# Patient Record
Sex: Male | Born: 1955 | Race: White | Hispanic: No | Marital: Married | Smoking: Never smoker
Health system: Southern US, Community
[De-identification: ages and names within clinical notes are randomized; demographics above are authoritative.]

## PROBLEM LIST (undated history)

## (undated) DIAGNOSIS — E785 Hyperlipidemia, unspecified: Secondary | ICD-10-CM

## (undated) DIAGNOSIS — C801 Malignant (primary) neoplasm, unspecified: Secondary | ICD-10-CM

## (undated) DIAGNOSIS — E079 Disorder of thyroid, unspecified: Secondary | ICD-10-CM

## (undated) HISTORY — DX: Hyperlipidemia, unspecified: E78.5

## (undated) HISTORY — DX: Malignant (primary) neoplasm, unspecified: C80.1

## (undated) HISTORY — PX: TONSILLECTOMY: SUR1361

---

## 2008-05-08 ENCOUNTER — Ambulatory Visit: Payer: Self-pay | Admitting: Diagnostic Radiology

## 2008-05-08 ENCOUNTER — Ambulatory Visit (HOSPITAL_BASED_OUTPATIENT_CLINIC_OR_DEPARTMENT_OTHER): Admission: RE | Admit: 2008-05-08 | Discharge: 2008-05-08 | Payer: Self-pay | Admitting: Internal Medicine

## 2010-09-24 ENCOUNTER — Ambulatory Visit: Payer: Self-pay | Admitting: Internal Medicine

## 2011-09-24 ENCOUNTER — Telehealth: Payer: Self-pay | Admitting: Internal Medicine

## 2011-09-24 ENCOUNTER — Encounter: Payer: Self-pay | Admitting: Internal Medicine

## 2011-09-24 ENCOUNTER — Ambulatory Visit (INDEPENDENT_AMBULATORY_CARE_PROVIDER_SITE_OTHER): Payer: BC Managed Care – PPO | Admitting: Internal Medicine

## 2011-09-24 VITALS — BP 124/90 | HR 67 | Temp 98.1°F | Resp 16 | Ht 71.0 in | Wt 218.0 lb

## 2011-09-24 DIAGNOSIS — E785 Hyperlipidemia, unspecified: Secondary | ICD-10-CM

## 2011-09-24 DIAGNOSIS — K760 Fatty (change of) liver, not elsewhere classified: Secondary | ICD-10-CM

## 2011-09-24 DIAGNOSIS — K7689 Other specified diseases of liver: Secondary | ICD-10-CM

## 2011-09-24 DIAGNOSIS — E039 Hypothyroidism, unspecified: Secondary | ICD-10-CM

## 2011-09-24 DIAGNOSIS — Z0000 Encounter for general adult medical examination without abnormal findings: Secondary | ICD-10-CM

## 2011-09-24 LAB — LIPID PANEL
Cholesterol: 151 mg/dL (ref 0–200)
LDL Cholesterol: 69 mg/dL (ref 0–99)
Total CHOL/HDL Ratio: 4.9 Ratio
Triglycerides: 256 mg/dL — ABNORMAL HIGH (ref <150)
VLDL: 51 mg/dL — ABNORMAL HIGH (ref 0–40)

## 2011-09-24 LAB — HEPATIC FUNCTION PANEL
ALT: 79 U/L — ABNORMAL HIGH (ref 0–53)
Indirect Bilirubin: 0.6 mg/dL (ref 0.0–0.9)
Total Protein: 7.6 g/dL (ref 6.0–8.3)

## 2011-09-24 LAB — BASIC METABOLIC PANEL
BUN: 18 mg/dL (ref 6–23)
Chloride: 103 mEq/L (ref 96–112)
Glucose, Bld: 90 mg/dL (ref 70–99)
Potassium: 4.4 mEq/L (ref 3.5–5.3)
Sodium: 139 mEq/L (ref 135–145)

## 2011-09-24 LAB — CBC WITH DIFFERENTIAL/PLATELET
Basophils Absolute: 0 10*3/uL (ref 0.0–0.1)
Basophils Relative: 0 % (ref 0–1)
HCT: 46.6 % (ref 39.0–52.0)
Hemoglobin: 15.7 g/dL (ref 13.0–17.0)
Lymphocytes Relative: 44 % (ref 12–46)
Monocytes Absolute: 0.4 10*3/uL (ref 0.1–1.0)
Monocytes Relative: 9 % (ref 3–12)
Neutro Abs: 2.1 10*3/uL (ref 1.7–7.7)
Neutrophils Relative %: 45 % (ref 43–77)
WBC: 4.6 10*3/uL (ref 4.0–10.5)

## 2011-09-24 LAB — TSH: TSH: 5.645 u[IU]/mL — ABNORMAL HIGH (ref 0.350–4.500)

## 2011-09-24 LAB — T4, FREE: Free T4: 1.25 ng/dL (ref 0.80–1.80)

## 2011-09-24 LAB — PSA: PSA: 0.39 ng/mL (ref <=4.00)

## 2011-09-24 MED ORDER — LEVOTHYROXINE SODIUM 50 MCG PO TABS: 50.0000 ug | ORAL_TABLET | Freq: Every day | ORAL | Status: DC

## 2011-09-24 NOTE — Patient Instructions (Signed)
Please schedule labs prior to your next visit (tsh/free t4-hypothyroidism) Also please sign a record release for your last colonoscopy report

## 2011-09-24 NOTE — Telephone Encounter (Signed)
Please schedule labs prior to your next visit (tsh/free t4-hypothyroidism)    Patient has upcoming appointment in march 2014. He will be going to [facility] lab.

## 2011-09-25 LAB — URINALYSIS, ROUTINE W REFLEX MICROSCOPIC
Bilirubin Urine: NEGATIVE
Hgb urine dipstick: NEGATIVE
Ketones, ur: NEGATIVE mg/dL
Nitrite: NEGATIVE
Protein, ur: NEGATIVE mg/dL
Specific Gravity, Urine: 1.017 (ref 1.005–1.030)
Urobilinogen, UA: 0.2 mg/dL (ref 0.0–1.0)

## 2011-09-26 DIAGNOSIS — K760 Fatty (change of) liver, not elsewhere classified: Secondary | ICD-10-CM | POA: Insufficient documentation

## 2011-09-26 DIAGNOSIS — Z0000 Encounter for general adult medical examination without abnormal findings: Secondary | ICD-10-CM | POA: Insufficient documentation

## 2011-09-26 DIAGNOSIS — E782 Mixed hyperlipidemia: Secondary | ICD-10-CM | POA: Insufficient documentation

## 2011-09-26 DIAGNOSIS — E039 Hypothyroidism, unspecified: Secondary | ICD-10-CM | POA: Insufficient documentation

## 2011-09-26 DIAGNOSIS — E785 Hyperlipidemia, unspecified: Secondary | ICD-10-CM | POA: Insufficient documentation

## 2011-09-26 NOTE — Assessment & Plan Note (Signed)
Nl exam. EKG demonstrates sb 59 with nl intervals and axis. Obtain cpe labs including psa (pro's and cons discussed). Obtain record release for colonoscopy results

## 2011-09-26 NOTE — Progress Notes (Signed)
  Subjective:    Patient ID: Mathew Atkins, male    DOB: October 17, 1955, 56 y.o.   MRN: [id number]  HPI Pt presents to clinic to establish care and cpe. H/o hypothyroidism with reportedly stable dose. BP minimally elevated but typically normotensive. Has known fatty liver by [place] and last available lft's reviewed as nl. Colonoscopy reportedly utd but no record available. (2010)   PMH: hypothyroidism, hyperlipidemia and fatty liver No past surgical history on file.  reports that he has never smoked. He does not have any smokeless tobacco history on file. His alcohol and drug histories not on file. family history is not on file. No Known Allergies   Review of Systems  Respiratory: Negative for shortness of breath.   Cardiovascular: Negative for chest pain.  Gastrointestinal: Negative for abdominal pain.  All other systems reviewed and are negative.       Objective:   Physical Exam  Nursing note and vitals reviewed. Constitutional: He appears well-developed and well-nourished. No distress.  HENT:  Head: Normocephalic and atraumatic.  Right Ear: Tympanic membrane, external ear and ear canal normal.  Left Ear: Tympanic membrane, external ear and ear canal normal.  Nose: Nose normal.  Mouth/Throat: Oropharynx is clear and moist. No oropharyngeal exudate.  Eyes: Conjunctivae and EOM are normal. Pupils are equal, round, and reactive to light. No scleral icterus.  Neck: Neck supple. No thyromegaly present.  Cardiovascular: Normal rate, regular rhythm, normal heart sounds and intact distal pulses.  Exam reveals no gallop and no friction rub.   No murmur heard. Pulmonary/Chest: Effort normal and breath sounds normal. No respiratory distress. He has no wheezes. He has no rales.  Abdominal: Soft. Bowel sounds are normal. He exhibits no distension and no mass. There is no tenderness. There is no rebound and no guarding.  Lymphadenopathy:    He has no cervical adenopathy.  Neurological: He is  alert.  Skin: Skin is warm and dry. He is not diaphoretic.  Psychiatric: He has a normal mood and affect.          Assessment & Plan:

## 2011-10-04 ENCOUNTER — Telehealth: Payer: Self-pay | Admitting: *Deleted

## 2011-10-04 ENCOUNTER — Encounter: Payer: Self-pay | Admitting: *Deleted

## 2011-10-04 DIAGNOSIS — E039 Hypothyroidism, unspecified: Secondary | ICD-10-CM

## 2011-10-04 DIAGNOSIS — E785 Hyperlipidemia, unspecified: Secondary | ICD-10-CM

## 2011-10-04 MED ORDER — LEVOTHYROXINE SODIUM 75 MCG PO TABS: 75.0000 ug | ORAL_TABLET | Freq: Every day | ORAL | Status: DC

## 2011-10-04 NOTE — Telephone Encounter (Signed)
Patient returned phone call. [facility] # [phone] ext. [phone]

## 2011-10-04 NOTE — Telephone Encounter (Signed)
Message copied by Regis Bill on Mon Oct 04, 2011  1:00 PM ------      Message from: Edwyna Perfect      Created: Sun Oct 03, 2011  9:45 PM       Thyroid is slightly underactive on synthroid 50. Increase to qd. Liver tests minimally high with h/o fatty liver. tg mildly high and hdl low.       Recommend low fat diet and regular exercise. Obtain tsh/free t4 (hypothyroidism) , lipid/lft-272.4 in ~10wk

## 2011-10-04 NOTE — Telephone Encounter (Signed)
LMOM with contact name for return call RE: results & further MD instructions/SLS New Rx pending verification of pharmacy, future lab orders entered.

## 2011-10-04 NOTE — Telephone Encounter (Signed)
Patient informed, Rx to pharmacy, understood & agreed; copy labs mailed at pt request/SLS

## 2011-10-06 ENCOUNTER — Telehealth: Payer: Self-pay | Admitting: Internal Medicine

## 2011-10-06 NOTE — Telephone Encounter (Signed)
Received medical records from [facility]  P: [phone] F: [phone]

## 2011-10-06 NOTE — Telephone Encounter (Signed)
Received medical records from [facility] ° °P: [phone] °F: [phone] °

## 2011-12-08 ENCOUNTER — Telehealth: Payer: Self-pay | Admitting: Internal Medicine

## 2011-12-08 NOTE — Telephone Encounter (Signed)
Refill- levothyroxine 0.075mg ( ) tabs. tk 1 T PO D. Qty 90 last fill 9.16.13

## 2011-12-08 NOTE — Telephone Encounter (Signed)
Last Rx 09.16.13 #90; verified with pharmacy--too soon for refill/SLS

## 2011-12-10 LAB — LIPID PANEL
HDL: 34 mg/dL — ABNORMAL LOW (ref >39)
LDL Cholesterol: 71 mg/dL (ref 0–99)
Total CHOL/HDL Ratio: 3.9 Ratio
VLDL: 29 mg/dL (ref 0–40)

## 2011-12-10 LAB — HEPATIC FUNCTION PANEL
AST: 27 U/L (ref 0–37)
Albumin: 4.6 g/dL (ref 3.5–5.2)
Bilirubin, Direct: 0.1 mg/dL (ref 0.0–0.3)
Total Bilirubin: 0.7 mg/dL (ref 0.3–1.2)

## 2011-12-10 NOTE — Addendum Note (Signed)
Addended by: Regis Bill on: 12/10/2011 12:02 PM   Modules accepted: Orders

## 2011-12-15 MED ORDER — LEVOTHYROXINE SODIUM 75 MCG PO TABS: 75.0000 ug | ORAL_TABLET | Freq: Every day | ORAL | Status: DC

## 2011-12-15 NOTE — Telephone Encounter (Signed)
Pt called stating he is running out of thyroid medication; called pt to inquire as to why [pharmacy confirms pt p/u 90-day supply 09.16.13]; pt reports he "lost some medication down the drain"; Rx to pharmacy/SLS

## 2011-12-15 NOTE — Addendum Note (Signed)
Addended by: Regis Bill on: 12/15/2011 03:54 PM   Modules accepted: Orders

## 2012-03-04 ENCOUNTER — Other Ambulatory Visit: Payer: Self-pay

## 2012-03-16 ENCOUNTER — Telehealth: Payer: Self-pay

## 2012-03-16 LAB — TSH: TSH: 2.568 u[IU]/mL (ref 0.350–4.500)

## 2012-03-16 LAB — T4, FREE: Free T4: 1.4 ng/dL (ref 0.80–1.80)

## 2012-03-16 NOTE — Telephone Encounter (Signed)
Pt is coming in today for labs. Labs ordered

## 2012-03-22 ENCOUNTER — Ambulatory Visit (INDEPENDENT_AMBULATORY_CARE_PROVIDER_SITE_OTHER): Payer: BC Managed Care – PPO | Admitting: Family

## 2012-03-22 ENCOUNTER — Encounter: Payer: Self-pay | Admitting: Family

## 2012-03-22 VITALS — BP 118/80 | HR 78 | Temp 98.0°F | Resp 16 | Ht 71.0 in | Wt 224.0 lb

## 2012-03-22 DIAGNOSIS — K760 Fatty (change of) liver, not elsewhere classified: Secondary | ICD-10-CM

## 2012-03-22 DIAGNOSIS — K7689 Other specified diseases of liver: Secondary | ICD-10-CM

## 2012-03-22 DIAGNOSIS — E039 Hypothyroidism, unspecified: Secondary | ICD-10-CM

## 2012-03-22 MED ORDER — LEVOTHYROXINE SODIUM 75 MCG PO TABS: 75.0000 ug | ORAL_TABLET | Freq: Every day | ORAL | Status: DC

## 2012-03-22 NOTE — Assessment & Plan Note (Signed)
TSH is 2.5, continue current dose of levothyroxine.

## 2012-03-22 NOTE — Progress Notes (Signed)
  Subjective:    Patient ID: Mathew Atkins, male    DOB: 11-15-1955, 57 y.o.   MRN: [id number]  HPI  Mathew Atkins is a 57 yr old male who presents today for follow up.    Hypothyroid-  Pt continue levothyroxine.  Reports that he feels "a little tired" but that he doesn't always sleep that well and he has had a lot of stress at work.  He has gained 6 pounds since his last visit.   Fatty liver disease- reports that he tries to keep a low fat diet.   Review of Systems See HPI  No past medical history on file.  History   Social History  . Marital Status: Married    Spouse Name: N/A    Number of Children: N/A  . Years of Education: N/A   Occupational History  . Not on file.   Social History Main Topics  . Smoking status: Never Smoker   . Smokeless tobacco: Not on file  . Alcohol Use: Not on file  . Drug Use: Not on file  . Sexually Active: Not on file   Other Topics Concern  . Not on file   Social History Narrative  . No narrative on file    No past surgical history on file.  No family history on file.  No Known Allergies  Current Outpatient Prescriptions on File Prior to Visit  Medication Sig Dispense Refill  . Ascorbic Acid (VITAMIN C) 1000 MG tablet Take 1,000 mg by mouth 2 (two) times daily.      Marland Kitchen aspirin 81 MG tablet Take 81 mg by mouth daily.      . Flaxseed, Linseed, (FLAX SEED OIL PO) Take 2,400 mg by mouth 2 (two) times daily.      . niacin 500 MG tablet Take 500 mg by mouth daily with breakfast.      . vitamin E 400 UNIT capsule Take 400 Units by mouth daily.       No current facility-administered medications on file prior to visit.    BP 118/80  Pulse 78  Temp(Src) 98 F (36.7 C) (Oral)  Resp 16  Ht 5\' 11"  (1.803 m)  Wt 224 lb 0.6 oz (101.624 kg)  BMI 31.26 kg/m2  SpO2 98%       Objective:   Physical Exam  Constitutional: He is oriented to person, place, and time. He appears well-developed and well-nourished. No distress.  HENT:   Head: Normocephalic and atraumatic.  Cardiovascular: Normal rate and regular rhythm.   No murmur heard. Pulmonary/Chest: Effort normal and breath sounds normal. No respiratory distress. He has no wheezes. He has no rales. He exhibits no tenderness.  Musculoskeletal: He exhibits no edema.  Neurological: He is alert and oriented to person, place, and time.  Skin: Skin is warm and dry.  Psychiatric: He has a normal mood and affect. His behavior is normal. Judgment and thought content normal.          Assessment & Plan:

## 2012-03-22 NOTE — Patient Instructions (Addendum)
Please follow up for a fasting physical after 09/28/12.

## 2012-03-22 NOTE — Assessment & Plan Note (Signed)
We discussed importance of low fat/low cholesterol diet, exercise, and weight loss.

## 2012-09-23 ENCOUNTER — Other Ambulatory Visit: Payer: Self-pay | Admitting: Family

## 2012-10-03 ENCOUNTER — Encounter: Payer: BC Managed Care – PPO | Admitting: Family

## 2012-11-23 ENCOUNTER — Other Ambulatory Visit: Payer: Self-pay

## 2016-09-01 ENCOUNTER — Emergency Department (HOSPITAL_BASED_OUTPATIENT_CLINIC_OR_DEPARTMENT_OTHER)
Admission: EM | Admit: 2016-09-01 | Discharge: 2016-09-01 | Disposition: A | Discharge status: Home / Self Care | Payer: BLUE CROSS/BLUE SHIELD | Attending: Emergency Medicine | Admitting: Emergency Medicine

## 2016-09-01 ENCOUNTER — Emergency Department (HOSPITAL_BASED_OUTPATIENT_CLINIC_OR_DEPARTMENT_OTHER): Payer: BLUE CROSS/BLUE SHIELD

## 2016-09-01 ENCOUNTER — Encounter (HOSPITAL_BASED_OUTPATIENT_CLINIC_OR_DEPARTMENT_OTHER): Payer: Self-pay

## 2016-09-01 DIAGNOSIS — R1031 Right lower quadrant pain: Secondary | ICD-10-CM | POA: Diagnosis not present

## 2016-09-01 DIAGNOSIS — N23 Unspecified renal colic: Secondary | ICD-10-CM | POA: Diagnosis not present

## 2016-09-01 DIAGNOSIS — R911 Solitary pulmonary nodule: Secondary | ICD-10-CM | POA: Diagnosis not present

## 2016-09-01 DIAGNOSIS — Z79899 Other long term (current) drug therapy: Secondary | ICD-10-CM | POA: Diagnosis not present

## 2016-09-01 DIAGNOSIS — E039 Hypothyroidism, unspecified: Secondary | ICD-10-CM | POA: Diagnosis not present

## 2016-09-01 DIAGNOSIS — M549 Dorsalgia, unspecified: Secondary | ICD-10-CM | POA: Diagnosis present

## 2016-09-01 HISTORY — DX: Disorder of thyroid, unspecified: E07.9

## 2016-09-01 LAB — CBC WITH DIFFERENTIAL/PLATELET
BASOS ABS: 0 10*3/uL (ref 0.0–0.1)
BASOS PCT: 1 %
EOS ABS: 0.1 10*3/uL (ref 0.0–0.7)
EOS PCT: 1 %
HCT: 44.2 % (ref 39.0–52.0)
HEMOGLOBIN: 14.8 g/dL (ref 13.0–17.0)
LYMPHS ABS: 1.2 10*3/uL (ref 0.7–4.0)
Lymphocytes Relative: 25 %
MCH: 29.5 pg (ref 26.0–34.0)
MCHC: 33.5 g/dL (ref 30.0–36.0)
MCV: 88 fL (ref 78.0–100.0)
Monocytes Absolute: 0.4 10*3/uL (ref 0.1–1.0)
Monocytes Relative: 9 %
NEUTROS PCT: 64 %
Neutro Abs: 3.2 10*3/uL (ref 1.7–7.7)
PLATELETS: 180 10*3/uL (ref 150–400)
RBC: 5.02 MIL/uL (ref 4.22–5.81)
RDW: 13.5 % (ref 11.5–15.5)
WBC: 4.9 10*3/uL (ref 4.0–10.5)

## 2016-09-01 LAB — URINALYSIS, MICROSCOPIC (REFLEX)

## 2016-09-01 LAB — URINALYSIS, ROUTINE W REFLEX MICROSCOPIC
Bilirubin Urine: NEGATIVE
GLUCOSE, UA: NEGATIVE mg/dL
Ketones, ur: NEGATIVE mg/dL
Nitrite: NEGATIVE
PH: 5.5 (ref 5.0–8.0)
Protein, ur: 30 mg/dL — AB
SPECIFIC GRAVITY, URINE: 1.022 (ref 1.005–1.030)

## 2016-09-01 LAB — BASIC METABOLIC PANEL
ANION GAP: 8 (ref 5–15)
BUN: 20 mg/dL (ref 6–20)
CHLORIDE: 103 mmol/L (ref 101–111)
CO2: 27 mmol/L (ref 22–32)
CREATININE: 1.33 mg/dL — AB (ref 0.61–1.24)
Calcium: 8.9 mg/dL (ref 8.9–10.3)
GFR, EST NON AFRICAN AMERICAN: 57 mL/min — AB (ref >60)
Glucose, Bld: 161 mg/dL — ABNORMAL HIGH (ref 65–99)
POTASSIUM: 4.2 mmol/L (ref 3.5–5.1)
Sodium: 138 mmol/L (ref 135–145)

## 2016-09-01 MED ORDER — KETOROLAC TROMETHAMINE 15 MG/ML IJ SOLN
15.0000 mg | Freq: Once | INTRAMUSCULAR | Status: AC
  Administered 2016-09-01: 15 mg via INTRAVENOUS
  Filled 2016-09-01: qty 1

## 2016-09-01 MED ORDER — HYDROCODONE-ACETAMINOPHEN 5-325 MG PO TABS: 1.0000 | ORAL_TABLET | Freq: Four times a day (QID) | ORAL | 0 refills | Status: DC | PRN

## 2016-09-01 MED ORDER — TAMSULOSIN HCL 0.4 MG PO CAPS: 0.4000 mg | ORAL_CAPSULE | Freq: Every day | ORAL | 0 refills | Status: DC

## 2016-09-01 MED ORDER — ONDANSETRON 8 MG PO TBDP
8.0000 mg | ORAL_TABLET | Freq: Three times a day (TID) | ORAL | 0 refills | Status: DC | PRN
Start: 2016-09-01 — End: 2023-04-05

## 2016-09-01 NOTE — ED Notes (Signed)
Pt given cup to attempt urine sample. 

## 2016-09-01 NOTE — ED Triage Notes (Addendum)
Pt reports sudden onset right lower back pain since 1 am - denies radiation of pain, denies incontinence, denies dysuria, dark urine, injury or history of same. States 2 Advil taken @ 0140 and they were ineffective. Pt states he is unable to sit still or find a comfortable position due to the pain intensity.

## 2016-09-01 NOTE — Discharge Instructions (Signed)
We saw you in the ER for the abdominal pain. Our results indicate that you have a kidney stone. We were able to get your pain is relative control, and we can safely send you home.  Take the meds prescribed. Set up an appointment with the Urologist. If the pain is unbearable, you start having fevers, chills, and are unable to keep any meds down - then return to the ER.  CT results also showed an incidental lung nodule - please ensure your primary doctor is aware of it. 1. 6 mm RIGHT ureteropelvic junction urolithiasis resulting in mild hydronephrosis. Punctate nonobstructing LEFT nephrolithiasis. 2. **An incidental finding of potential clinical significance has been found. 10 mm RIGHT middle lobe pulmonary nodule recommend follow-up CT chest at 3-6 months to confirm persistence.

## 2016-09-01 NOTE — ED Notes (Signed)
No adverse effects noted to IM injection.  

## 2016-09-01 NOTE — ED Provider Notes (Signed)
[facility]   CSN: [id number] Arrival date & time: 09/01/16  0256     History   Chief Complaint Chief Complaint  Patient presents with  . Back Pain    HPI Mathew Atkins is a 61 y.o. male.  HPI Pt comes in with cc of R sided flank pain. Pt has hx of hypothyroidism, no surgical hx and no hx of smoking or kidney stones. Pt reports having sudden onset severe R flank pain that has been waxing and waning since 1:45 am. Pt has no nausea, emesis, diaphoresis. Pt has no uti like symptoms.  Past Medical History:  Diagnosis Date  . Thyroid disease     Patient Active Problem List   Diagnosis Date Noted  . Annual physical exam 09/26/2011  . Hypothyroidism 09/26/2011  . Other and unspecified hyperlipidemia 09/26/2011  . Fatty liver 09/26/2011    Past Surgical History:  Procedure Laterality Date  . TONSILLECTOMY         Home Medications    Prior to Admission medications   Medication Sig Start Date End Date Taking? Authorizing Provider  Ascorbic Acid (VITAMIN C) 1000 MG tablet Take 1,000 mg by mouth 2 (two) times daily.   Yes [provider]  aspirin 81 MG tablet Take 81 mg by mouth daily.   Yes [provider]  Flaxseed, Linseed, (FLAX SEED OIL PO) Take 2,400 mg by mouth 2 (two) times daily.   Yes [provider]  levothyroxine (SYNTHROID, LEVOTHROID) 75 MCG tablet TAKE 1 TABLET BY MOUTH DAILY 09/23/12  Yes Sandford Craze, NP  niacin 500 MG tablet Take 500 mg by mouth daily with breakfast.   Yes [provider]  vitamin E 400 UNIT capsule Take 400 Units by mouth daily.   Yes [provider]  HYDROcodone-acetaminophen (NORCO/VICODIN) 5-325 MG tablet Take 1 tablet by mouth every 6 (six) hours as needed. 09/01/16   Derwood Kaplan, MD  ondansetron (ZOFRAN ODT) 8 MG disintegrating tablet Take 1 tablet (8 mg total) by mouth every 8 (eight) hours as needed for nausea. 09/01/16   Derwood Kaplan, MD    tamsulosin (FLOMAX) 0.4 MG CAPS capsule Take 1 capsule (0.4 mg total) by mouth daily. 09/01/16   Derwood Kaplan, MD    Family History History reviewed. No pertinent family history.  Social History Social History  Substance Use Topics  . Smoking status: Never Smoker  . Smokeless tobacco: Never Used  . Alcohol use Yes     Comment: 2 drinks a day     Allergies   Patient has no known allergies.   Review of Systems Review of Systems  Respiratory: Negative for shortness of breath.   Cardiovascular: Negative for chest pain.  Gastrointestinal: Positive for abdominal pain. Negative for nausea.  Genitourinary: Positive for flank pain. Negative for dysuria.  All other systems reviewed and are negative.    Physical Exam Updated Vital Signs BP (!) 147/92 (BP Location: Right Arm)   Pulse 65   Temp 97.6 F (36.4 C) (Oral)   Resp 14   Ht 5\' 11"  (1.803 m)   Wt 98.4 kg (217 lb)   SpO2 97%   BMI 30.27 kg/m   Physical Exam  Constitutional: He is oriented to person, place, and time. He appears well-developed.  HENT:  Head: Atraumatic.  Neck: Neck supple.  Cardiovascular: Normal rate.   Pulmonary/Chest: Effort normal.  Abdominal: Soft. There is no tenderness. There is no guarding.  R flank tenderness  Neurological: He  is alert and oriented to person, place, and time.  Skin: Skin is warm.  Nursing note and vitals reviewed.    ED Treatments / Results  Labs (all labs ordered are listed, but only abnormal results are displayed) Labs Reviewed  URINALYSIS, ROUTINE W REFLEX MICROSCOPIC - Abnormal; Notable for the following:       Result Value   Color, Urine AMBER (*)    APPearance CLOUDY (*)    Hgb urine dipstick LARGE (*)    Protein, ur 30 (*)    Leukocytes, UA TRACE (*)    All other components within normal limits  BASIC METABOLIC PANEL - Abnormal; Notable for the following:    Glucose, Bld 161 (*)    Creatinine, Ser 1.33 (*)    GFR calc non Af Amer 57 (*)    All  other components within normal limits  URINALYSIS, MICROSCOPIC (REFLEX) - Abnormal; Notable for the following:    Bacteria, UA RARE (*)    Squamous Epithelial / LPF 0-5 (*)    All other components within normal limits  CBC WITH DIFFERENTIAL/PLATELET    EKG  EKG Interpretation None       Radiology Ct Renal Stone Study  Result Date: 09/01/2016 CLINICAL DATA:  RIGHT flank pain for 4 hours, hematuria. EXAM: CT ABDOMEN AND PELVIS WITHOUT CONTRAST TECHNIQUE: Multidetector CT imaging of the abdomen and pelvis was performed following the standard protocol without IV contrast. COMPARISON:  Abdominal ultrasound May 08, 2008 FINDINGS: LOWER CHEST: 10 mm spiculated sub solid pulmonary nodule RIGHT middle lobe 5 and axial 2/30, series 3. 2 mm sub pleural RIGHT middle lobe pulmonary nodule is nonspecific. The visualized heart size is normal. Mild coronary artery calcification. No pericardial effusion. HEPATOBILIARY: Normal. PANCREAS: Normal. SPLEEN: Normal. ADRENALS/URINARY TRACT: Kidneys are orthotopic, demonstrating normal size and morphology. Mild RIGHT hydronephrosis, 6 mm obstructing RIGHT ureteropelvic junction calculus. Punctate LEFT interpolar nephrolithiasis without hydronephrosis. Limited assessment for renal masses on this nonenhanced examination. Urinary bladder is partially distended and unremarkable. Normal adrenal glands. STOMACH/BOWEL: The stomach, small and large bowel are normal in course and caliber without inflammatory changes, sensitivity decreased by lack of enteric contrast. Small duodenum diverticulum. Mild amount of retained large bowel stool. Normal appendix. VASCULAR/LYMPHATIC: Aortoiliac vessels are normal in course and caliber. No lymphadenopathy by CT size criteria. REPRODUCTIVE: Prominent scrotal pampiniform plexus seen with varicoceles. Normal prostate size. OTHER: No intraperitoneal free fluid or free air. MUSCULOSKELETAL: Non-acute. Subcentimeter lucency L1 most compatible  with hemangioma. Moderate to severe degenerative change of lumbar spine. Small fat containing LEFT inguinal hernia. IMPRESSION: 1. 6 mm RIGHT ureteropelvic junction urolithiasis resulting in mild hydronephrosis. Punctate nonobstructing LEFT nephrolithiasis. 2. **An incidental finding of potential clinical significance has been found. 10 mm RIGHT middle lobe pulmonary nodule recommend follow-up CT chest at 3-6 months to confirm persistence. This recommendation follows the consensus statement: Guidelines for Management of Small Pulmonary Nodules Detected on CT Scans: A Statement from the [employer] Society as published in Radiology 2005; 237:395-400.** Electronically Signed   By: Awilda Metro M.D.   On: 09/01/2016 05:15    Procedures Procedures (including critical care time)  Medications Ordered in ED Medications  ketorolac (TORADOL) 15 MG/ML injection 15 mg (not administered)  ketorolac (TORADOL) 15 MG/ML injection 15 mg (15 mg Intravenous Given 09/01/16 0529)     Initial Impression / Assessment and Plan / ED Course  I have reviewed the triage vital signs and the nursing notes.  Pertinent labs & imaging results that were available  during my care of the patient were reviewed by me and considered in my medical decision making (see chart for details).     Pt comes in with cc of R flank pain. Concerns for stones, and the CT confirm it. Pain is relatively controlled, and thus we will d/c with proper return precautions. Incidental findings also discussed.  Final Clinical Impressions(s) / ED Diagnoses   Final diagnoses:  Ureteral colic  Pulmonary nodule    New Prescriptions New Prescriptions   HYDROCODONE-ACETAMINOPHEN (NORCO/VICODIN) 5-325 MG TABLET    Take 1 tablet by mouth every 6 (six) hours as needed.   ONDANSETRON (ZOFRAN ODT) 8 MG DISINTEGRATING TABLET    Take 1 tablet (8 mg total) by mouth every 8 (eight) hours as needed for nausea.   TAMSULOSIN (FLOMAX) 0.4 MG CAPS CAPSULE     Take 1 capsule (0.4 mg total) by mouth daily.     Derwood KaplanNanavati, Keiston Manley, MD 09/01/16 [id number]

## 2018-07-23 IMAGING — CT CT RENAL STONE PROTOCOL
2 of 4 series · 16 of 46 positions shown, 18 images · non-contrast
Comparison: Abdominal ultrasound May 08, 2008

CLINICAL DATA: RIGHT flank pain for 4 hours, hematuria.

EXAM:
CT ABDOMEN AND PELVIS WITHOUT CONTRAST
TECHNIQUE: Multidetector CT imaging of the abdomen and pelvis was performed
following the standard protocol without IV contrast.

[Series 2: axial st · axial · 0.90mm/px · z∈[-516,-36]mm · 13 of 106 slices shown, 15 images]
[im 5/106  soft-tissue]
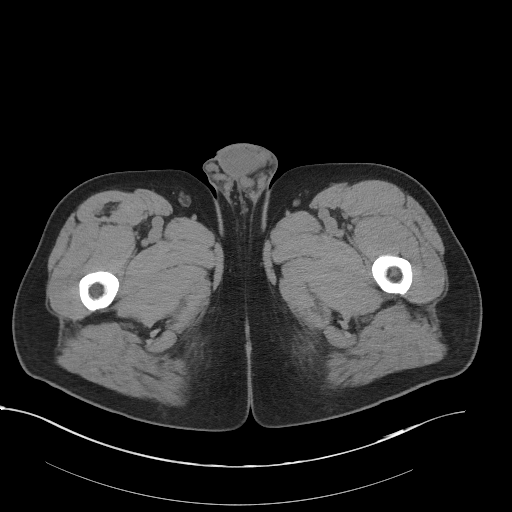
[im 5/106  bone]
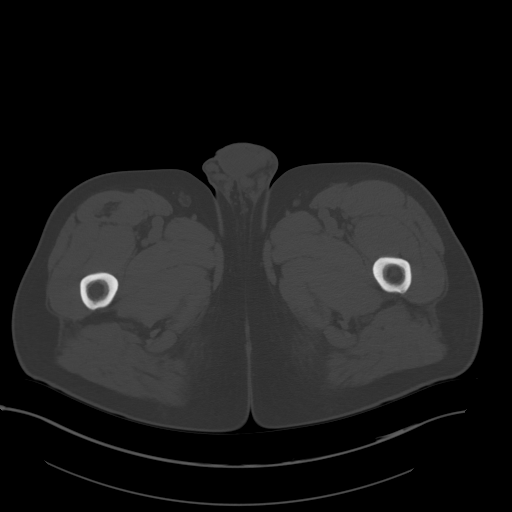
[im 14/106  soft-tissue]
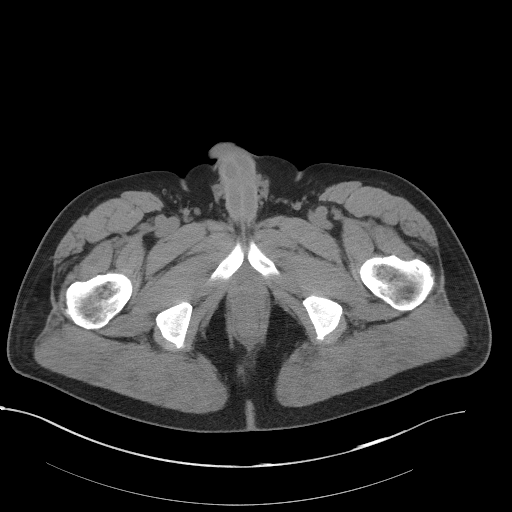
[im 22/106  soft-tissue]
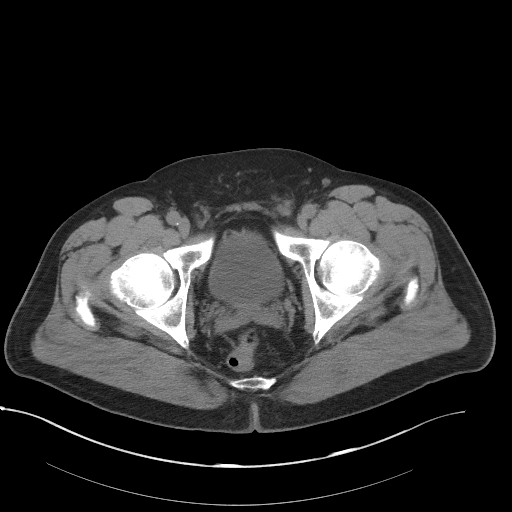
[im 31/106  soft-tissue]
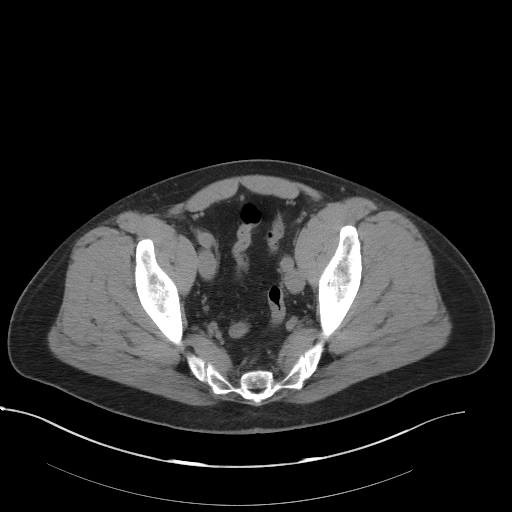
[im 36/106  soft-tissue]
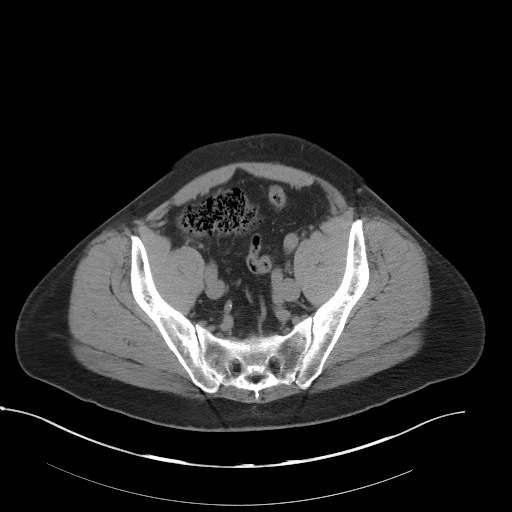
[im 44/106  soft-tissue]
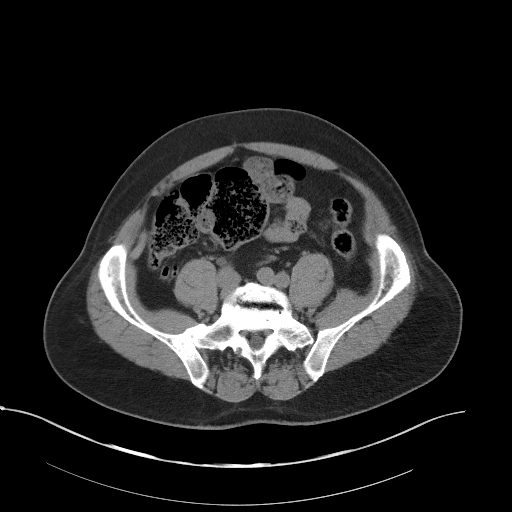
[im 53/106  soft-tissue]
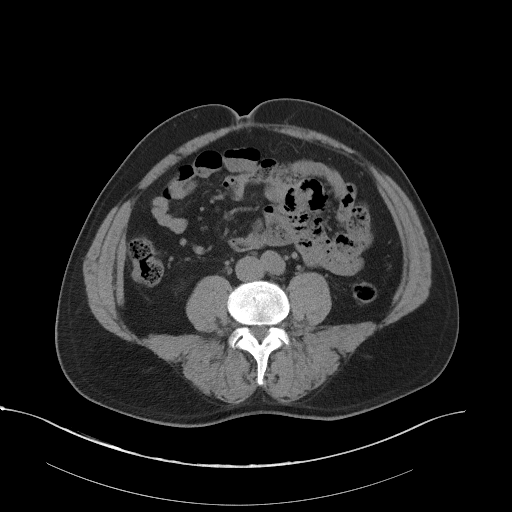
[im 62/106  soft-tissue]
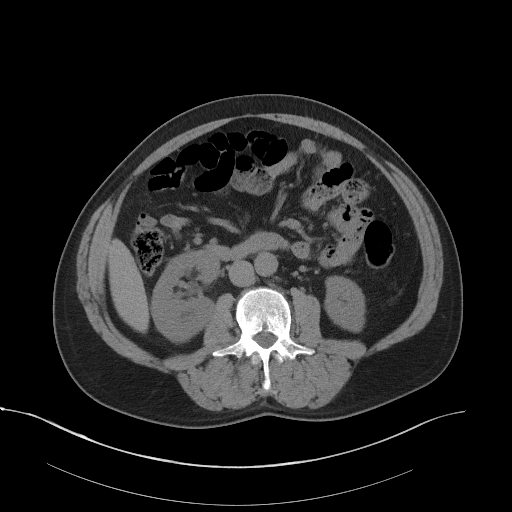
[im 71/106  soft-tissue]
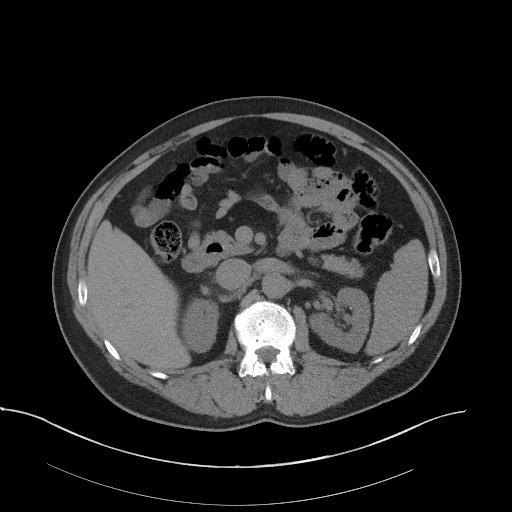
[im 71/106  bone]
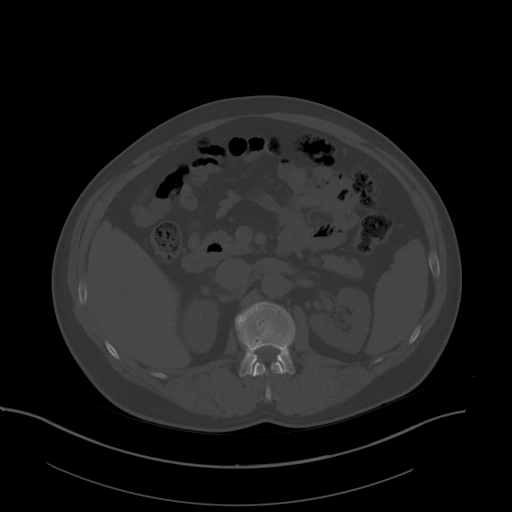
[im 75/106  soft-tissue]
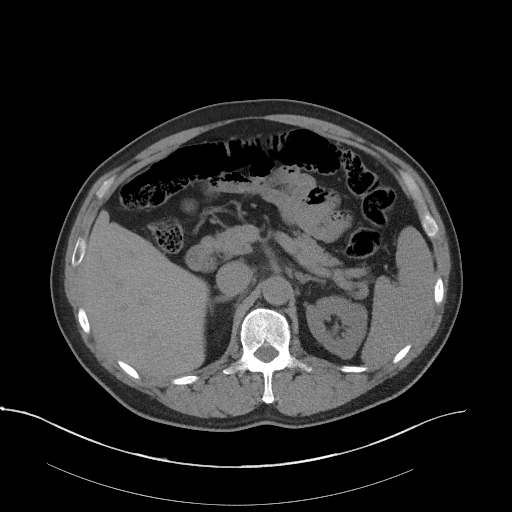
[im 84/106  soft-tissue]
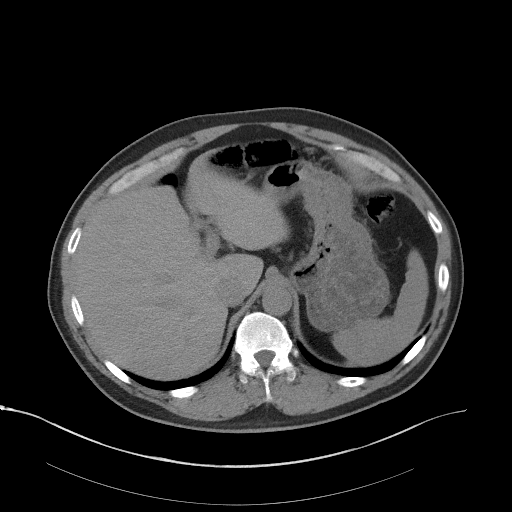
[im 92/106  soft-tissue]
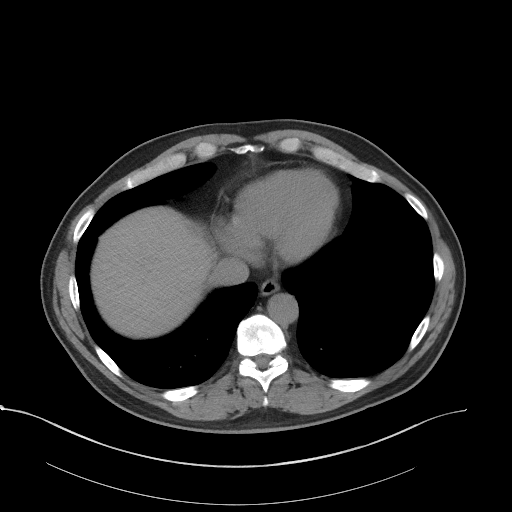
[im 101/106  soft-tissue]
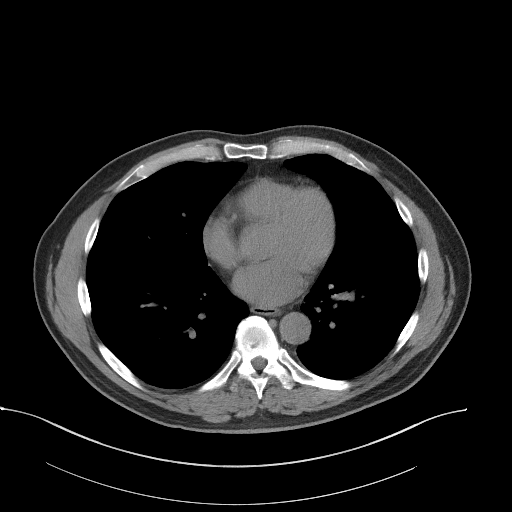

[Series 4: coronal st · coronal · 0.97mm/px · 3 of 87 slices shown]
[im 29/87  soft-tissue]
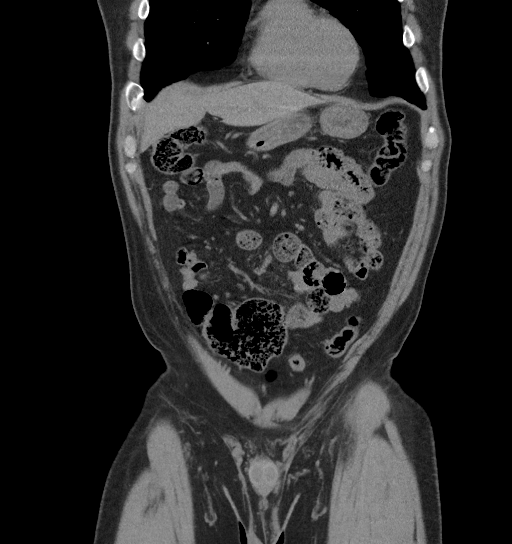
[im 39/87  soft-tissue]
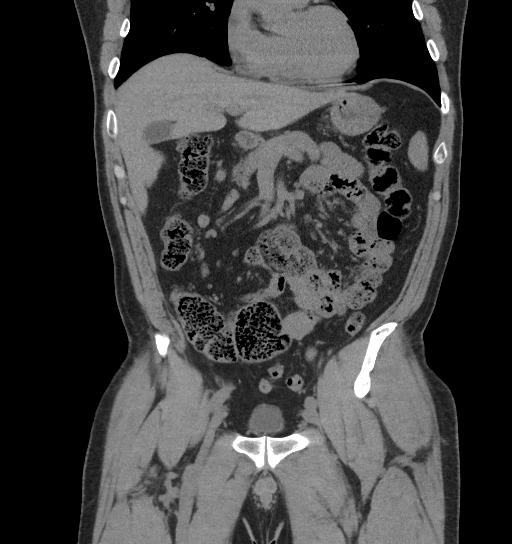
[im 48/87  soft-tissue]
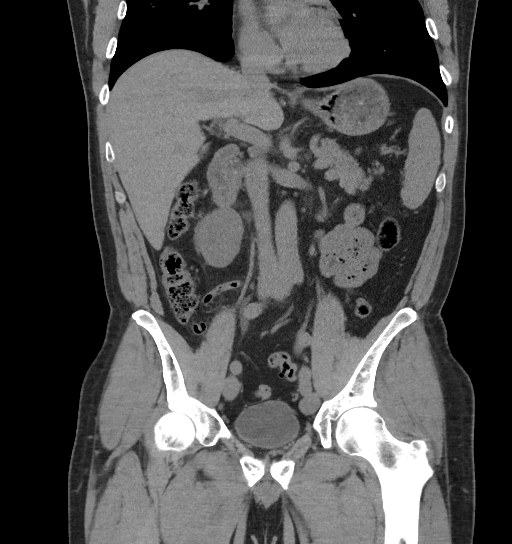

[16 of 46 positions shown; findings below may reference images not displayed]

FINDINGS: LOWER CHEST: 10 mm spiculated sub solid pulmonary nodule RIGHT
middle lobe 5 and axial [DATE], series 3. 2 mm sub pleural RIGHT
middle lobe pulmonary nodule is nonspecific. The visualized heart
size is normal. Mild coronary artery calcification. No pericardial
effusion.

HEPATOBILIARY: Normal.

PANCREAS: Normal.

SPLEEN: Normal.

ADRENALS/URINARY TRACT: Kidneys are orthotopic, demonstrating normal
size and morphology. Mild RIGHT hydronephrosis, 6 mm obstructing
RIGHT ureteropelvic junction calculus. Punctate LEFT interpolar
nephrolithiasis without hydronephrosis. Limited assessment for renal
masses on this nonenhanced examination. Urinary bladder is partially
distended and unremarkable. Normal adrenal glands.

STOMACH/BOWEL: The stomach, small and large bowel are normal in
course and caliber without inflammatory changes, sensitivity
decreased by lack of enteric contrast. Small duodenum diverticulum.
Mild amount of retained large bowel stool. Normal appendix.

VASCULAR/LYMPHATIC: Aortoiliac vessels are normal in course and
caliber. No lymphadenopathy by CT size criteria.

REPRODUCTIVE: Prominent scrotal pampiniform plexus seen with
varicoceles. Normal prostate size.

OTHER: No intraperitoneal free fluid or free air.

MUSCULOSKELETAL: Non-acute. Subcentimeter lucency L1 most compatible
with hemangioma. Moderate to severe degenerative change of lumbar
spine. Small fat containing LEFT inguinal hernia.
IMPRESSION: 1. 6 mm RIGHT ureteropelvic junction urolithiasis resulting in mild
hydronephrosis. Punctate nonobstructing LEFT nephrolithiasis.
2. **An incidental finding of potential clinical significance has
been found. 10 mm RIGHT middle lobe pulmonary nodule recommend
follow-up CT chest at 3-6 months to confirm persistence. This
recommendation follows the consensus statement: Guidelines for
Management of Small Pulmonary Nodules Detected on CT Scans: A
Statement from the [HOSPITAL] as published in Radiology

## 2019-06-26 LAB — HM COLONOSCOPY

## 2023-03-08 ENCOUNTER — Ambulatory Visit: Payer: BLUE CROSS/BLUE SHIELD | Admitting: Family Medicine

## 2023-03-29 ENCOUNTER — Ambulatory Visit: Payer: Medicare Other | Admitting: Family Medicine

## 2023-04-05 ENCOUNTER — Ambulatory Visit (INDEPENDENT_AMBULATORY_CARE_PROVIDER_SITE_OTHER): Admitting: Family Medicine

## 2023-04-05 VITALS — BP 136/88 | HR 71 | Temp 97.8°F | Ht 71.75 in | Wt 216.6 lb

## 2023-04-05 DIAGNOSIS — E782 Mixed hyperlipidemia: Secondary | ICD-10-CM

## 2023-04-05 DIAGNOSIS — K760 Fatty (change of) liver, not elsewhere classified: Secondary | ICD-10-CM | POA: Diagnosis not present

## 2023-04-05 DIAGNOSIS — M539 Dorsopathy, unspecified: Secondary | ICD-10-CM | POA: Insufficient documentation

## 2023-04-05 DIAGNOSIS — E039 Hypothyroidism, unspecified: Secondary | ICD-10-CM

## 2023-04-05 DIAGNOSIS — C3490 Malignant neoplasm of unspecified part of unspecified bronchus or lung: Secondary | ICD-10-CM | POA: Insufficient documentation

## 2023-04-05 DIAGNOSIS — R7303 Prediabetes: Secondary | ICD-10-CM | POA: Insufficient documentation

## 2023-04-05 NOTE — Progress Notes (Signed)
 Assessment/Plan:       Non-small cell lung cancer (NSCLC) NSCLC status post thoracotomy in January 2019. Currently under regular surveillance with CT scans. No evidence of lymphadenopathy or malignancy recurrence. Condition is well-managed. - Continue regular surveillance with CT scans as per oncology recommendations.  Prediabetes Elevated blood glucose with an A1c of 6.1%. No current need for pharmacological intervention. Emphasis on lifestyle modifications to prevent progression to diabetes. Discussed the importance of a healthy diet and regular exercise for maintaining health and preventing diabetes. - Recommend a healthy, low-carbohydrate, low-fat diet with fruits and vegetables. - Encourage regular cardiovascular and weightlifting exercises.  Hyperlipidemia Managed with atorvastatin 20 mg daily. Condition is well-controlled. - Continue atorvastatin 20 mg daily.  Hypothyroidism Managed with Synthroid 100 mcg daily. Condition is well-controlled. - Continue Synthroid 100 mcg daily.  Fatty liver disease Diagnosed approximately 10 years ago. Liver function tests including ALT, AST, albumin, and bilirubin are stable. No significant risk factors for progressive liver disease. Condition is well-managed. Discussed that fatty liver can ebb and flow with weight changes and lifestyle. - Encourage healthy lifestyle and weight management.  Degenerative disc disease Incidental finding on imaging. No associated back pain or symptoms reported. Considered a common finding at age 27 without clinical significance. Reassured that imaging findings do not necessarily correlate with symptoms. - Reassure about incidental finding. - Encourage core strengthening exercises.        Medications Discontinued During This Encounter  Medication Reason   Flaxseed, Linseed, (FLAX SEED OIL PO)    vitamin E 400 UNIT capsule    niacin 500 MG tablet    Ascorbic Acid (VITAMIN C) 1000 MG tablet    aspirin 81  MG tablet    levothyroxine (SYNTHROID, LEVOTHROID) 75 MCG tablet    HYDROcodone-acetaminophen (NORCO/VICODIN) 5-325 MG tablet    ondansetron (ZOFRAN ODT) 8 MG disintegrating tablet    tamsulosin (FLOMAX) 0.4 MG CAPS capsule     Return in about 4 weeks (around 05/03/2023) for physical (fasting labs).    Subjective:   Encounter date: 04/05/2023  Mathew Atkins is a 68 y.o. male who has Annual physical exam; Hypothyroidism; Other and unspecified hyperlipidemia; and Fatty liver on their problem list..   He  has a past medical history of Cancer (HCC) (2018), Hyperlipidemia (2022), and Thyroid disease.Marland Kitchen   He presents with chief complaint of Establish Care (No concerns. Requesting colonoscopy from [facility]) .   Discussed the use of AI scribe software for clinical note transcription with the patient, who gave verbal consent to proceed.  History of Present Illness   Mathew Atkins is a 68 year old male who presents to establish care.  He is transitioning care from [facility] due to issues with record management, including having two profiles that inaccurately listed his alcohol consumption as 102 drinks per week. He clarifies that he does not consume alcohol to that extent.  He has a history of non-small cell lung cancer and underwent a thoracotomy in January 2019 at [facility]. He reports doing 'surprisingly great' post-surgery and undergoes regular surveillance with CT scans, which have shown no lymphadenopathy or malignancy.  He has high cholesterol and is currently taking atorvastatin 20 mg daily.  He has hypothyroidism and takes Synthroid 100 mcg daily.  He was diagnosed with fatty liver approximately ten years ago following an ultrasound. His liver function tests, including ALT, AST, albumin, and bilirubin, were stable as of December last year. He has no significant risk factors for ongoing liver  disease.  He has been screened for diabetes, with a recent A1c of 6.1. He is maintaining a  healthy lifestyle with a low-carb, low-fat diet and regular exercise.  He reports joint issues in his left knee and hip and has an appointment scheduled with an orthopedist.  He mentions incidental findings of degenerative spine changes on CT scans, but he does not experience back pain. No chest pain, abdominal pain, or leg swelling.       ROS  Past Surgical History:  Procedure Laterality Date   TONSILLECTOMY      Outpatient Medications Prior to Visit  Medication Sig Dispense Refill   atorvastatin (LIPITOR) 20 MG tablet Take 20 mg by mouth daily.     levothyroxine (SYNTHROID) 100 MCG tablet Take 100 mcg by mouth.     Ascorbic Acid (VITAMIN C) 1000 MG tablet Take 1,000 mg by mouth 2 (two) times daily.     aspirin 81 MG tablet Take 81 mg by mouth daily.     Flaxseed, Linseed, (FLAX SEED OIL PO) Take 2,400 mg by mouth 2 (two) times daily.     HYDROcodone-acetaminophen (NORCO/VICODIN) 5-325 MG tablet Take 1 tablet by mouth every 6 (six) hours as needed. 15 tablet 0   levothyroxine (SYNTHROID, LEVOTHROID) 75 MCG tablet TAKE 1 TABLET BY MOUTH DAILY 90 tablet 1   niacin 500 MG tablet Take 500 mg by mouth daily with breakfast.     ondansetron (ZOFRAN ODT) 8 MG disintegrating tablet Take 1 tablet (8 mg total) by mouth every 8 (eight) hours as needed for nausea. 20 tablet 0   tamsulosin (FLOMAX) 0.4 MG CAPS capsule Take 1 capsule (0.4 mg total) by mouth daily. 10 capsule 0   vitamin E 400 UNIT capsule Take 400 Units by mouth daily.     No facility-administered medications prior to visit.    Family History  Problem Relation Age of Onset   Cancer Mother    Diabetes Mother    Arthritis Father    COPD Father     Social History   Socioeconomic History   Marital status: Married    Spouse name: Not on file   Number of children: Not on file   Years of education: Not on file   Highest education level: Bachelor's degree (e.g., BA, AB, BS)  Occupational History   Not on file  Tobacco Use    Smoking status: Never    Passive exposure: Never   Smokeless tobacco: Never   Tobacco comments:    Never a smoker.  Substance and Sexual Activity   Alcohol use: Yes    Alcohol/week: 8.0 standard drinks of alcohol    Types: 6 Cans of beer, 2 Shots of liquor per week    Comment: Weekends   Drug use: Never   Sexual activity: Yes    Birth control/protection: Coitus interruptus  Other Topics Concern   Not on file  Social History Narrative   Not on file   Social Drivers of Health   Financial Resource Strain: Low Risk  (04/05/2023)   Overall Financial Resource Strain (CARDIA)    Difficulty of Paying Living Expenses: Not hard at all  Food Insecurity: No Food Insecurity (04/05/2023)   Hunger Vital Sign    Worried About Running Out of Food in the Last Year: Never true    Ran Out of Food in the Last Year: Never true  Transportation Needs: No Transportation Needs (04/05/2023)   PRAPARE - Administrator, Civil Service (Medical): No  Lack of Transportation (Non-Medical): No  Physical Activity: Sufficiently Active (04/05/2023)   Exercise Vital Sign    Days of Exercise per Week: 5 days    Minutes of Exercise per Session: 30 min  Stress: No Stress Concern Present (04/05/2023)   [employer] of Occupational Health - Occupational Stress Questionnaire    Feeling of Stress : Not at all  Social Connections: Unknown (04/05/2023)   Social Connection and Isolation Panel [NHANES]    Frequency of Communication with Friends and Family: Once a week    Frequency of Social Gatherings with Friends and Family: Once a week    Attends Religious Services: Patient declined    Database administrator or Organizations: No    Attends Banker Meetings: Not on file    Marital Status: Married  Intimate Partner Violence: Not At Risk (05/06/2021)   Received from [facility] visits prior to 03/20/2022.   Humiliation, Afraid, Rape, and Kick questionnaire    Fear of  Current or Ex-Partner: No    Emotionally Abused: No    Physically Abused: No    Sexually Abused: No                                                                                                  Objective:  Physical Exam: BP 136/88   Pulse 71   Temp 97.8 F (36.6 C) (Temporal)   Ht 5' 11.75" (1.822 m)   Wt 216 lb 9.6 oz (98.2 kg)   SpO2 97%   BMI 29.58 kg/m     Physical Exam Constitutional:      Appearance: Normal appearance.  HENT:     Head: Normocephalic and atraumatic.     Right Ear: Hearing normal.     Left Ear: Hearing normal.     Nose: Nose normal.  Eyes:     General: No scleral icterus.       Right eye: No discharge.        Left eye: No discharge.     Extraocular Movements: Extraocular movements intact.  Cardiovascular:     Rate and Rhythm: Normal rate and regular rhythm.     Heart sounds: Normal heart sounds.  Pulmonary:     Effort: Pulmonary effort is normal.     Breath sounds: Normal breath sounds.  Abdominal:     Palpations: Abdomen is soft.     Tenderness: There is no abdominal tenderness.  Skin:    General: Skin is warm.     Findings: No rash.  Neurological:     General: No focal deficit present.     Mental Status: He is alert.     Cranial Nerves: No cranial nerve deficit.  Psychiatric:        Mood and Affect: Mood normal.        Behavior: Behavior normal.        Thought Content: Thought content normal.        Judgment: Judgment normal.      Contains abnormal data Hemoglobin A1C With Estimated Average Glucose Specimen: Blood - Venous structure (body structure)  Component Ref Range & Units 3 mo ago Comments  Hemoglobin A1c <5.7 % 6.1 High  Normal A1c:             Less than 5.7% Prediabetes range A1c:  5.7% to 6.4% Diabetes range A1c:     Greater than 6.4%  Estimated Average Glucose mg/dL 914 The ADA has supported the calculation of Average Glucose (eAG) based on HbA1c measurements. However, the eAG reflects the average glycemic level  over 2-3 months and it would not necessarily match single glucose laboratory measurements, nor reflect changes in the daily glucose concentration.  Resulting Agency [facility] [place]  [facility](CLIA# [id number])   Narrative Performed by [facility] [place] [facility](CLIA# [id number]) Presence of heterozygote variants in patients' samples do not interfere with accurate measurements of HbA1c using Trinity affinity boronate chromatography, when no other clinical conditions affecting quality/quantity of HbA and/or quantity of hemoglobin, overall, as well as quality/quantity of RBCs are concurrently present.   Presence of homozygote variants and/or other medical conditions affecting the quality/quantity of HbA (e.g. alpha- and beta-thalassemia) and/or quantity of hemoglobin, overall, as well as quality/quantity of RBCs (e.g. anemia of any cause) trigger inaccurate evaluations of the glycated HbA1c with any analytical method, including Trinity affinity boronate chromatography.  In these circumstances, fructosamine testing for these patients is recommended.  Hb F present at concentrations of 11% or above can interfere with accurate measurements of HbA1c.  In these circumstances, fructosamine testing for these patients is recommended.   Specimen Collected: 12/31/22 09:03   Performed by: Nada Maclachlan [place] [facility](CLIA# [id number]) Last Resulted: 12/31/22 13:11  Received From: [facility]  Result Received: 02/09/23 08:39   View Encounter   Recent Data from [facility] Related to Hemoglobin A1C With Estimated Average Glucose 05/14/22 12/31/22 Component  6 High   6.1 High   Hemoglobin A1c  126  128  Estimated Average Glucose    Contains abnormal data Comprehensive Metabolic Panel Specimen: Blood - Venous structure (body structure) Component Ref Range & Units 3 mo ago Comments  Sodium 136 - 145 mmol/L 137   Potassium 3.5 - 5.1 mmol/L 4.1 NO VISIBLE HEMOLYSIS   Chloride 98 - 107 mmol/L 103   CO2 21 - 31 mmol/L 27   Anion Gap 6 - 14 mmol/L 7   Glucose, Random 70 - 99 mg/dL 010 High    Blood Urea Nitrogen (BUN) 7 - 25 mg/dL 14   Creatinine 2.72 - 1.30 mg/dL 5.36   eGFR >64 QI/HKV/4.25Z5 71 GFR estimated by CKD-EPI equations(NKF 2021).  "Recommend confirmation of Cr-based eGFR by using Cys-based eGFR and other filtration markers (if applicable) in complex cases and clinical decision-making, as needed."  Albumin 3.5 - 5.7 g/dL 4.5   Total Protein 6.4 - 8.9 g/dL 7.1   Bilirubin, Total 0.3 - 1.0 mg/dL 1.1 High    Alkaline Phosphatase (ALP) 34 - 104 U/L 77   Aspartate Aminotransferase (AST) 13 - 39 U/L 21   Alanine Aminotransferase (ALT) 7 - 52 U/L 33   Calcium 8.6 - 10.3 mg/dL 9.5   BUN/Creatinine Ratio  Creatinine is normal, ratio is not clinically indicated.  Resulting Agency [facility] [place] [facility](CLIA# [id number])   Specimen Collected: 12/31/22 09:03   Performed by: Michel [facility](CLIA# [id number]) Last Resulted: 12/31/22 13:01  Received From: [facility]  Result Received: 02/09/23 08:39   View Encounter   Recent Data from [facility] Related to Comprehensive  Metabolic Panel 05/14/22 12/31/22 Component  139 137 Sodium  4.5  4.1  Potassium  103 103 Chloride  26 27 CO2  10 7 Anion Gap  99 118 High  Glucose, Random  14 14 Blood Urea Nitrogen (BUN)  1.08 1.14 Creatinine  77  71  eGFR  4.4 4.5 Albumin  7.1 7.1 Total Protein  2.1 High  1.1 High  Bilirubin, Total  60 77 Alkaline Phosphatase (ALP)  25 21 Aspartate Aminotransferase (AST)  32 33 Alanine Aminotransferase (ALT)  9.4 9.5 Calcium  --  --  BUN/Creatinine Ratio   TSH Specimen: Blood - Venous structure (body structure) Component Ref Range & Units 3 mo ago  TSH 0.450 - 5.330 uIU/mL 3.408  Resulting Agency [facility] [place] [facility](CLIA# [id number])  Specimen Collected: 12/31/22 09:03   Performed by:  Nada Maclachlan [place] [facility](CLIA# [id number]) Last Resulted: 12/31/22 13:01  Received From: [facility]  Result Received: 02/09/23 08:39   View Encounter   Recent Data from [facility] Related to [facility] 05/14/22 12/31/22 Component  1.582 3.408 TSH   Hepatitis C Virus (HCV) Antibody Screen With Confirmation Specimen: Blood - Venous structure (body structure) Component Ref Range & Units 3 mo ago Comments  Hepatitis C Virus Antibody Non-Reactive Non-Reactive The performance of the ADVIA Centaur HCV assay has not been established with neonatal specimens.  Resulting Agency [facility] [place] [facility](CLIA# [id number])   Specimen Collected: 12/31/22 09:03   Performed by: Michel H[facility](CLIA# [id number]) Last Resulted: 12/31/22 13:34  Received From: [facility]  Result Received: 02/09/23 08:39   View Encounter   Physical Exam Constitutional:      Appearance: Normal appearance.  HENT:     Head: Normocephalic and atraumatic.     Right Ear: Hearing normal.     Left Ear: Hearing normal.     Nose: Nose normal.  Eyes:     General: No scleral icterus.       Right eye: No discharge.        Left eye: No discharge.     Extraocular Movements: Extraocular movements intact.  Cardiovascular:     Rate and Rhythm: Normal rate and regular rhythm.     Heart sounds: Normal heart sounds.  Pulmonary:     Effort: Pulmonary effort is normal.     Breath sounds: Normal breath sounds.  Abdominal:     Palpations: Abdomen is soft.     Tenderness: There is no abdominal tenderness.  Skin:    General: Skin is warm.     Findings: No rash.  Neurological:     General: No focal deficit present.     Mental Status: He is alert.     Cranial Nerves: No cranial nerve deficit.  Psychiatric:        Mood and Affect: Mood normal.        Behavior: Behavior normal.        Thought Content: Thought content normal.        Judgment: Judgment normal.        [facility] Outside Information   CT chest without contrast with 3D MIPS Protocol  Anatomical Region Laterality Modality  Chest -- Computed Tomography  ORTHO Chest -- --   Narrative  Examination:  CT CHEST WITHOUT CONTRAST WITH 3D MIPS PROTOCOL Patient Name:  Mathew Atkins Reason provided in the MEDICAL RECORD NUMBER" hx resected T1b adenocarcinoma lung cancer, eval for stable, C34.2  Malignant neoplasm of middle lobe, bronchus or lung (CMS/HHS-HCC)"  The report below contains medical terminology and recommendations which are best discussed with your ordering provider.   COMPARISON: March 05, 2022 through May 17, 2019     Protocol:  Chest CT WO Protocol.  Contiguous 0.625 mm axial images with 5 mm axial, 3 mm coronal, and 3 mm sagittal reconstructions were obtained from the neck base through the upper abdomen without IV contrast material. In addition, 3-D maximal intensity projection (MIP) reconstructions were performed to potentially increase the sensitivity for the detection of pulmonary nodules.  Findings: Chest wall/mediastinum: No thoracic lymphadenopathy. Pericardium normal thickness. Thoracic aorta normal caliber.  Pleura: Normal  Lung parenchyma: Remote right middle lobectomy. Lobectomy stump expected appearance. Triangular-shaped/bandlike pleural opacity lateral basilar segment right lower lobe unchanged over multiple examinations, favor postsurgical scarring. Immediately anterior to this, stable 6.5 mm noncalcified nodule, also highly likely benign. 5 mm groundglass nodule apical segment right upper lobe (4:108) unchanged few smaller scattered left lung nodules 5 mm solid nodule lateral basilar segment right lower lobe (4:283) unchanged  Few tiny left lung nodules are also unchanged, largest in the lateral basilar segment left lower lobe measuring 6 mm (4:491)  No central endobronchial lesions. No bronchiectasis. No  honeycombing.  Upper abdomen: Normal adrenal glands.  Osseous findings: No suspicious lytic or blastic foci within the visualized axial skeleton.  IMPRESSION: 1.  Remote right middle lobectomy. 2.  Negative for intrathoracic recurrence/metastasis.  Electronically Signed by:  Erenest Blank, MD, [facility] Electronically Signed on:  02/09/2023 8:11 AM Procedure Note  Milus Height, MD - 02/09/2023 Formatting of this note might be different from the original. 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   COMPARISON: March 05, 2022 through May 17, 2019     Protocol:  Chest CT WO Protocol.  Contiguous 0.625 mm axial images with 5 mm axial, 3 mm coronal, and 3 mm sagittal reconstructions were obtained from the neck base through the upper abdomen without IV contrast material. In addition, 3-D maximal intensity projection (MIP) reconstructions were performed to potentially increase the sensitivity for the detection of pulmonary nodules.  Findings: Chest wall/mediastinum: No thoracic lymphadenopathy. Pericardium normal thickness. Thoracic aorta normal caliber.  Pleura: Normal  Lung parenchyma: Remote right middle lobectomy. Lobectomy stump expected appearance. Triangular-shaped/bandlike pleural opacity lateral basilar segment right lower lobe unchanged over multiple examinations, favor postsurgical scarring. Immediately anterior to this, stable 6.5 mm noncalcified nodule, also highly likely benign. 5 mm groundglass nodule apical segment right upper lobe (4:108) unchanged few smaller scattered left lung nodules 5  mm solid nodule lateral basilar segment right lower lobe (4:283) unchanged  Few tiny left lung nodules are also unchanged, largest in the lateral basilar segment left lower lobe measuring 6 mm (4:491)  No central endobronchial lesions. No bronchiectasis. No honeycombing.  Upper abdomen: Normal adrenal glands.  Osseous findings: No suspicious lytic or blastic foci within the visualized axial skeleton.  IMPRESSION: 1.  Remote right middle lobectomy. 2.  Negative for intrathoracic recurrence/metastasis.  Electronically Signed by:  Erenest Blank, MD, [facility] Electronically Signed on:  02/09/2023 8:11 AM Exam End: 02/09/23 07:42   Specimen Collected: 02/09/23 07:30 Last Resulted:  02/09/23 08:11  Received From: [facility]  Result Received: 03/16/23 14:52   View Encounter    No results found.  No results found for this or any previous visit (from the past 2160 hours).      Garner Nash, MD, MS

## 2023-05-09 ENCOUNTER — Ambulatory Visit: Admitting: Family Medicine

## 2023-05-27 ENCOUNTER — Telehealth: Payer: Self-pay

## 2023-05-27 ENCOUNTER — Encounter: Payer: Self-pay | Admitting: Family Medicine

## 2023-05-27 ENCOUNTER — Ambulatory Visit (INDEPENDENT_AMBULATORY_CARE_PROVIDER_SITE_OTHER): Admitting: Family Medicine

## 2023-05-27 VITALS — BP 134/96 | HR 71 | Temp 97.4°F | Resp 18 | Wt 210.6 lb

## 2023-05-27 DIAGNOSIS — C3490 Malignant neoplasm of unspecified part of unspecified bronchus or lung: Secondary | ICD-10-CM

## 2023-05-27 DIAGNOSIS — N529 Male erectile dysfunction, unspecified: Secondary | ICD-10-CM

## 2023-05-27 DIAGNOSIS — E559 Vitamin D deficiency, unspecified: Secondary | ICD-10-CM

## 2023-05-27 DIAGNOSIS — N429 Disorder of prostate, unspecified: Secondary | ICD-10-CM

## 2023-05-27 DIAGNOSIS — E782 Mixed hyperlipidemia: Secondary | ICD-10-CM | POA: Diagnosis not present

## 2023-05-27 DIAGNOSIS — E039 Hypothyroidism, unspecified: Secondary | ICD-10-CM

## 2023-05-27 DIAGNOSIS — Z789 Other specified health status: Secondary | ICD-10-CM

## 2023-05-27 DIAGNOSIS — M539 Dorsopathy, unspecified: Secondary | ICD-10-CM

## 2023-05-27 DIAGNOSIS — Z114 Encounter for screening for human immunodeficiency virus [HIV]: Secondary | ICD-10-CM

## 2023-05-27 DIAGNOSIS — R7303 Prediabetes: Secondary | ICD-10-CM | POA: Diagnosis not present

## 2023-05-27 DIAGNOSIS — K760 Fatty (change of) liver, not elsewhere classified: Secondary | ICD-10-CM | POA: Diagnosis not present

## 2023-05-27 DIAGNOSIS — Z1159 Encounter for screening for other viral diseases: Secondary | ICD-10-CM

## 2023-05-27 DIAGNOSIS — Z125 Encounter for screening for malignant neoplasm of prostate: Secondary | ICD-10-CM

## 2023-05-27 DIAGNOSIS — R030 Elevated blood-pressure reading, without diagnosis of hypertension: Secondary | ICD-10-CM

## 2023-05-27 LAB — LIPID PANEL
Cholesterol: 119 mg/dL (ref 0–200)
HDL: 41.7 mg/dL (ref >39.00)
LDL Cholesterol: 60 mg/dL (ref 0–99)
NonHDL: 76.98
Total CHOL/HDL Ratio: 3
Triglycerides: 86 mg/dL (ref 0.0–149.0)
VLDL: 17.2 mg/dL (ref 0.0–40.0)

## 2023-05-27 LAB — CBC WITH DIFFERENTIAL/PLATELET
Basophils Absolute: 0 10*3/uL (ref 0.0–0.1)
Basophils Relative: 1 % (ref 0.0–3.0)
Eosinophils Absolute: 0.1 10*3/uL (ref 0.0–0.7)
Eosinophils Relative: 2.5 % (ref 0.0–5.0)
HCT: 47.4 % (ref 39.0–52.0)
Hemoglobin: 15.5 g/dL (ref 13.0–17.0)
Lymphocytes Relative: 28.1 % (ref 12.0–46.0)
Lymphs Abs: 1.2 10*3/uL (ref 0.7–4.0)
MCHC: 32.6 g/dL (ref 30.0–36.0)
MCV: 89.4 fl (ref 78.0–100.0)
Monocytes Absolute: 0.4 10*3/uL (ref 0.1–1.0)
Monocytes Relative: 7.9 % (ref 3.0–12.0)
Neutro Abs: 2.7 10*3/uL (ref 1.4–7.7)
Neutrophils Relative %: 60.5 % (ref 43.0–77.0)
Platelets: 233 10*3/uL (ref 150.0–400.0)
RBC: 5.3 Mil/uL (ref 4.22–5.81)
RDW: 15.2 % (ref 11.5–15.5)
WBC: 4.4 10*3/uL (ref 4.0–10.5)

## 2023-05-27 LAB — COMPREHENSIVE METABOLIC PANEL WITH GFR
ALT: 23 U/L (ref 0–53)
AST: 19 U/L (ref 0–37)
Albumin: 4.5 g/dL (ref 3.5–5.2)
Alkaline Phosphatase: 70 U/L (ref 39–117)
BUN: 16 mg/dL (ref 6–23)
CO2: 29 meq/L (ref 19–32)
Calcium: 9.5 mg/dL (ref 8.4–10.5)
Chloride: 102 meq/L (ref 96–112)
Creatinine, Ser: 1.01 mg/dL (ref 0.40–1.50)
GFR: 78.09 mL/min (ref >60.00)
Glucose, Bld: 105 mg/dL — ABNORMAL HIGH (ref 70–99)
Potassium: 4.4 meq/L (ref 3.5–5.1)
Sodium: 139 meq/L (ref 135–145)
Total Bilirubin: 1.3 mg/dL — ABNORMAL HIGH (ref 0.2–1.2)
Total Protein: 7.1 g/dL (ref 6.0–8.3)

## 2023-05-27 LAB — HEMOGLOBIN A1C: Hgb A1c MFr Bld: 6.1 % (ref 4.6–6.5)

## 2023-05-27 LAB — MICROALBUMIN / CREATININE URINE RATIO
Creatinine,U: 54.2 mg/dL
Microalb Creat Ratio: UNDETERMINED mg/g (ref 0.0–30.0)
Microalb, Ur: <0.7 mg/dL

## 2023-05-27 LAB — PSA: PSA: 0.72 ng/mL (ref 0.10–4.00)

## 2023-05-27 MED ORDER — ATORVASTATIN CALCIUM 20 MG PO TABS: 20.0000 mg | ORAL_TABLET | Freq: Every day | ORAL | 3 refills | Status: AC

## 2023-05-27 MED ORDER — LEVOTHYROXINE SODIUM 100 MCG PO TABS: 100.0000 ug | ORAL_TABLET | Freq: Every day | ORAL | 3 refills | Status: DC

## 2023-05-27 MED ORDER — TADALAFIL 5 MG PO TABS: 5.0000 mg | ORAL_TABLET | Freq: Every day | ORAL | 11 refills | Status: DC

## 2023-05-27 MED ORDER — TADALAFIL 5 MG PO TABS: 5.0000 mg | ORAL_TABLET | Freq: Every day | ORAL | 11 refills | Status: DC | PRN

## 2023-05-27 NOTE — Patient Instructions (Signed)
  VISIT SUMMARY: Today, you had a follow-up visit to review your chronic conditions and routine screenings. We discussed your hyperlipidemia, hypothyroidism, degenerative disc disease, and history of non-small cell lung cancer in remission. You also mentioned new knee pain and erectile dysfunction. We reviewed your current medications and vaccination status.  YOUR PLAN: -ELEVATED BLOOD PRESSURE: Your blood pressure was slightly high today. High blood pressure can increase the risk of heart disease. We discussed lifestyle changes like a low salt diet and exercise. We will recheck your blood pressure and may consider medication if it remains high.  -MIXED HYPERLIPIDEMIA: You have high cholesterol, which can lead to heart disease. You are currently taking atorvastatin 20 mg daily. We will monitor your cholesterol and liver function with a lipid panel and liver function tests.  -PREDIABETES: You have prediabetes, which means your blood sugar levels are higher than normal but not high enough to be diabetes. We will check your hemoglobin A1c and fasting glucose to monitor your condition.  -HYPOTHYROIDISM: You have an underactive thyroid, which can cause fatigue and weight gain. You are taking levothyroxine  100 mcg daily. We will monitor your thyroid function with a thyroid panel and TSH test.  -METABOLIC DYSFUNCTION-ASSOCIATED STEATOTIC LIVER DISEASE: This condition involves fat buildup in the liver, which can lead to liver damage. We will monitor your liver function and platelet levels and calculate a fibrosis score to determine if further imaging is needed.  -DEGENERATIVE DISC DISEASE WITH CHRONIC BACK PAIN: You have chronic back pain due to wear and tear on the discs in your spine. You are taking naproxen sodium 220 mg as needed. We discussed the potential risks of long-term NSAID use.  -ERECTILE DYSFUNCTION: You are experiencing difficulty achieving erections when desired. We will prescribe tadalafil 5  mg as needed and check your testosterone levels.  -NON-SMALL CELL LUNG CANCER: Your lung cancer is in remission, but we need to continue monitoring for any changes. We will check your blood counts and vitamin D levels.  -GENERAL HEALTH MAINTENANCE: We discussed your vaccination status and general health maintenance. You are up to date on most vaccines. We will check your immunity to measles, discuss the RSV vaccine, and consider a COVID booster based on your age and risk factors.  INSTRUCTIONS: Please schedule a follow-up visit after your lab results are available. Continue working with your orthopedist regarding your knee issues.

## 2023-05-27 NOTE — Telephone Encounter (Signed)
 Pharmacy Patient Advocate Encounter   Received notification from CoverMyMeds that prior authorization for Tadalafil 5MG  tablets  is required/requested.   Insurance verification completed.   The patient is insured through [facility] .   Per test claim: PA required; PA started via CoverMyMeds. KEY [id number] . Waiting for clinical questions to populate.

## 2023-05-27 NOTE — Progress Notes (Signed)
 Assessment & Plan   Assessment/Plan:    Assessment & Plan Wellness Visit Routine wellness visit addressing chronic conditions and preventive care, with emphasis on heart disease prevention due to high cholesterol and elevated blood pressure. - Perform comprehensive metabolic panel including lipid panel and LFTs - Check hemoglobin A1c and fasting glucose - Order urinalysis with microscopic analysis and microalbumin creatinine ratio - Screen PSA for prostate cancer,  - Screen for HIV and hepatitis C - Check CBC and vitamin D levels - Order thyroid panel with TSH - Add MMR titer to assess immunity - Check testosterone levels  Elevated blood pressure Blood pressure recorded at 134/96, indicating possible mild hypertension. Discussed lifestyle modifications such as low salt diet and exercise, considering knee pain limitations. - Recheck blood pressure - Consider antihypertensive medication if blood pressure remains elevated  Mixed hyperlipidemia Managed with atorvastatin 20 mg daily. Monitoring lipid levels and liver function is required. - Perform lipid panel - Check LFTs with comprehensive metabolic panel  Prediabetes Monitoring required to assess current status and prevent progression to diabetes. - Check hemoglobin A1c and fasting glucose  Hypothyroidism Managed with levothyroxine  100 mcg daily. Monitoring thyroid function is required. - Order thyroid panel with TSH  Metabolic dysfunction-associated steatotic liver disease Monitoring liver function and platelet levels to determine if further imaging is necessary. 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 Discussed potential GI and kidney risks of NSAIDs.  Erectile dysfunction Experiencing erectile dysfunction with spontaneous erections at inappropriate times. Prefers tadalafil  for management due to its longer half-life and convenience. - Prescribe tadalafil 5 mg as needed - Check testosterone levels, thyroid levels, vitamin D levels, urinalysis, prostate  Non-small cell lung cancer In remission with ongoing surveillance. Monitoring for hematologic or malignancy changes is required. - Check CBC and vitamin D levels  General Health Maintenance Discussion of vaccinations and general health maintenance. Up to date on most vaccinations. Consideration of MMR immunity, RSV vaccine, and COVID booster based on age and risk factors. - Check MMR titer - Discuss RSV vaccine - Discuss COVID booster - Ensure up-to-date on pneumonia, tetanus, and flu vaccines  Follow-up Follow-up plans for ongoing management and monitoring of chronic conditions. Continued collaboration with orthopedist regarding knee issues. - Schedule follow-up visit after lab results are available - Continue working with orthopedist regarding knee issues      Medications Discontinued During This Encounter  Medication Reason   atorvastatin (LIPITOR) 20 MG tablet Reorder   levothyroxine  (SYNTHROID ) 100 MCG tablet Reorder   tadalafil (CIALIS) 5 MG tablet     Return in about 3 months (around 08/27/2023) for BP, ED.        Subjective:   Encounter date: 05/27/2023  Mathew Atkins is a 68 y.o. male who has Annual physical exam; Hypothyroidism; Mixed hyperlipidemia; Metabolic dysfunction-associated steatotic liver disease (MASLD); NSCLC without metastasis (HCC); Multilevel degenerative disc disease; and Prediabetes on their problem list..   He  has a past medical history of Cancer (HCC) (2018), Hyperlipidemia (2022), and Thyroid disease (2009 onset).Mathew Atkins   He presents with chief complaint of Annual Exam (Pt is fasting today //HM due- HIV and Hep C screening ) .   Discussed the use of AI scribe software for clinical note transcription with the patient, who gave verbal consent to proceed.  History  of Present Illness Mathew Atkins is a  68 year old male who presents for follow-up and routine screening.  He is here for follow-up on chronic conditions including hyperlipidemia, hypothyroidism, multilevel degenerative disc disease, and a history of non-small cell lung cancer in remission. He is currently on atorvastatin 20 mg daily for hyperlipidemia and levothyroxine  100 mcg daily for hypothyroidism. He takes naproxen sodium 220 mg as needed for chronic back pain due to degenerative disc disease.  He has a history of non-small cell lung cancer, which is currently in remission, and he is under surveillance with oncology. He also has a history of prediabetes and metabolic dysfunction associated steatotic liver disease (MASLD).  He has been experiencing knee pain since his last visit. He consulted an orthopedist, received a Kenalog injection, and underwent an MRI which revealed torn cartilage. He has been taking Aleve for pain management.  He reports erectile dysfunction, noting that erections occur spontaneously at inappropriate times, such as early morning, but not when desired. No chest pain or shortness of breath.  He inquires about vaccinations, mentioning he is up to date on pneumonia, tetanus, COVID, and flu vaccines. He expresses concern about the effectiveness of the measles vaccine received during childhood.     ROS  Past Surgical History:  Procedure Laterality Date   TONSILLECTOMY      Outpatient Medications Prior to Visit  Medication Sig Dispense Refill   naproxen sodium (ALEVE) 220 MG tablet      atorvastatin (LIPITOR) 20 MG tablet Take 20 mg by mouth daily.     levothyroxine  (SYNTHROID ) 100 MCG tablet Take 100 mcg by mouth.     No facility-administered medications prior to visit.    Family History  Problem Relation Age of Onset   Cancer Mother    Diabetes Mother    Arthritis Father    COPD Father     Social History   Socioeconomic History   Marital status:  Married    Spouse name: Not on file   Number of children: Not on file   Years of education: Not on file   Highest education level: Bachelor's degree (e.g., BA, AB, BS)  Occupational History   Not on file  Tobacco Use   Smoking status: Never    Passive exposure: Never   Smokeless tobacco: Never   Tobacco comments:    Never a smoker.  Substance and Sexual Activity   Alcohol use: Yes    Alcohol/week: 8.0 standard drinks of alcohol    Types: 6 Cans of beer, 2 Shots of liquor per week    Comment: Weekends   Drug use: Never   Sexual activity: Yes    Birth control/protection: Coitus interruptus  Other Topics Concern   Not on file  Social History Narrative   Not on file   Social Drivers of Health   Financial Resource Strain: Low Risk  (04/05/2023)   Overall Financial Resource Strain (CARDIA)    Difficulty of Paying Living Expenses: Not hard at all  Food Insecurity: No Food Insecurity (04/05/2023)   Hunger Vital Sign    Worried About Running Out of Food in the Last Year: Never true    Ran Out of Food in the Last Year: Never true  Transportation Needs: No Transportation Needs (04/05/2023)   PRAPARE - Administrator, Civil Service (Medical): No    Lack of Transportation (Non-Medical): No  Physical Activity: Sufficiently Active (04/05/2023)   Exercise Vital Sign    Days of Exercise per Week: 5 days    Minutes of  Exercise per Session: 30 min  Stress: No Stress Concern Present (04/05/2023)   [employer] of Occupational Health - Occupational Stress Questionnaire    Feeling of Stress : Not at all  Social Connections: Unknown (04/05/2023)   Social Connection and Isolation Panel [NHANES]    Frequency of Communication with Friends and Family: Once a week    Frequency of Social Gatherings with Friends and Family: Once a week    Attends Religious Services: Patient declined    Database administrator or Organizations: No    Attends Banker Meetings: Not on file     Marital Status: Married  Intimate Partner Violence: Not At Risk (05/06/2021)   Received from [facility] visits prior to 03/20/2022.   Humiliation, Afraid, Rape, and Kick questionnaire    Fear of Current or Ex-Partner: No    Emotionally Abused: No    Physically Abused: No    Sexually Abused: No                                                                                                  Objective:  Physical Exam: BP (!) 134/96 (BP Location: Right Arm, Patient Position: Sitting, Cuff Size: Normal) Comment: recheck reading  Pulse 71   Temp (!) 97.4 F (36.3 C) (Temporal)   Resp 18   Wt 210 lb 9.6 oz (95.5 kg)   SpO2 98%   BMI 28.76 kg/m    Physical Exam VITALS: BP- 134/96 GENERAL: Alert, cooperative, well developed, no acute distress. HEENT: Normocephalic, normal oropharynx, moist mucous membranes. CHEST: Clear to auscultation bilaterally, no wheezes, rhonchi, or crackles. CARDIOVASCULAR: Normal heart rate and rhythm, S1 and S2 normal without murmurs. ABDOMEN: Soft, non-tender, non-distended, without organomegaly, normal bowel sounds. EXTREMITIES: No cyanosis or edema. NEUROLOGICAL: Cranial nerves II-XII grossly intact, motor function intact in all extremities, moves all extremities without gross motor or sensory deficit.   Physical Exam  No results found.  No results found for this or any previous visit (from the past 2160 hours).      Carnell Christian, MD, MS

## 2023-05-30 ENCOUNTER — Other Ambulatory Visit (HOSPITAL_COMMUNITY): Payer: Self-pay

## 2023-05-30 LAB — HCV INTERPRETATION

## 2023-05-30 LAB — HCV AB W REFLEX TO QUANT PCR: HCV Ab: NONREACTIVE

## 2023-05-30 LAB — TESTOSTERONE,FREE AND TOTAL
Testosterone, Free: 6.5 pg/mL — ABNORMAL LOW (ref 6.6–18.1)
Testosterone: 591 ng/dL (ref 264–916)

## 2023-05-30 NOTE — Telephone Encounter (Signed)
 Clinical questions have been answered and PA submitted.TO PLAN. PA currently Pending.

## 2023-05-30 NOTE — Telephone Encounter (Signed)
 Pharmacy Patient Advocate Encounter  Received notification from [facility] Medicaid that Prior Authorization for Tadalafil 5MG  tablets has been APPROVED from 05/16/2023 to 01/17/2098  SEE APPROVAL OUTCOME BELOW Outcome Approved today by [facility] Medicare 2017 Approved. This drug has been approved under the Member's Medicare Part D benefit. Approved quantity: 30 units per 30 day(s). You may fill up to a 90 day supply except for those on Specialty Tier 5, which can be filled up to a 30 day supply. Please call the pharmacy to process the prescription claim. Effective Date: 05/16/2023 Authorization Expiration Date: 01/17/2098   PA #/Case ID/Reference #: [id number]

## 2023-06-01 LAB — URINALYSIS W MICROSCOPIC + REFLEX CULTURE
Bacteria, UA: NONE SEEN /HPF
Bilirubin Urine: NEGATIVE
Glucose, UA: NEGATIVE
Hgb urine dipstick: NEGATIVE
Hyaline Cast: NONE SEEN /LPF
Ketones, ur: NEGATIVE
Leukocyte Esterase: NEGATIVE
Nitrites, Initial: NEGATIVE
Protein, ur: NEGATIVE
RBC / HPF: NONE SEEN /HPF (ref 0–2)
Specific Gravity, Urine: 1.01 (ref 1.001–1.035)
Squamous Epithelial / HPF: NONE SEEN /HPF (ref <=5)
WBC, UA: NONE SEEN /HPF (ref 0–5)
pH: 7.5 (ref 5.0–8.0)

## 2023-06-01 LAB — VITAMIN D 1,25 DIHYDROXY
Vitamin D 1, 25 (OH)2 Total: 45 pg/mL (ref 18–72)
Vitamin D2 1, 25 (OH)2: <8 pg/mL
Vitamin D3 1, 25 (OH)2: 45 pg/mL

## 2023-06-01 LAB — THYROID PANEL WITH TSH
Free Thyroxine Index: 3 (ref 1.4–3.8)
T3 Uptake: 35 % (ref 22–35)
T4, Total: 8.5 ug/dL (ref 4.9–10.5)
TSH: 1.8 m[IU]/L (ref 0.40–4.50)

## 2023-06-01 LAB — MEASLES/MUMPS/RUBELLA IMMUNITY
Mumps IgG: 85.3 [AU]/ml
Rubella: 26.4 {index}
Rubeola IgG: >300 [AU]/ml

## 2023-06-01 LAB — NO CULTURE INDICATED

## 2023-06-01 LAB — HIV ANTIBODY (ROUTINE TESTING W REFLEX): HIV 1&2 Ab, 4th Generation: NONREACTIVE

## 2023-06-07 ENCOUNTER — Ambulatory Visit: Payer: Self-pay | Admitting: Family Medicine

## 2023-06-29 HISTORY — PX: MENISCUS REPAIR: SHX5179

## 2023-08-30 ENCOUNTER — Ambulatory Visit (INDEPENDENT_AMBULATORY_CARE_PROVIDER_SITE_OTHER): Admitting: Family Medicine

## 2023-08-30 ENCOUNTER — Encounter: Payer: Self-pay | Admitting: Family Medicine

## 2023-08-30 VITALS — BP 122/84 | HR 67 | Temp 97.1°F | Resp 18 | Wt 213.6 lb

## 2023-08-30 DIAGNOSIS — E039 Hypothyroidism, unspecified: Secondary | ICD-10-CM

## 2023-08-30 DIAGNOSIS — R030 Elevated blood-pressure reading, without diagnosis of hypertension: Secondary | ICD-10-CM | POA: Insufficient documentation

## 2023-08-30 DIAGNOSIS — E782 Mixed hyperlipidemia: Secondary | ICD-10-CM

## 2023-08-30 DIAGNOSIS — Z9889 Other specified postprocedural states: Secondary | ICD-10-CM | POA: Insufficient documentation

## 2023-08-30 NOTE — Progress Notes (Signed)
 Assessment & Plan   Assessment/Plan:    Assessment & Plan Elevated blood pressure Blood pressure improved from 134/96 to 122/84 with lifestyle modifications. No symptoms of chest pain, shortness of breath, headaches, or blurred vision. Reduction in Aleve use contributed to improvement. Goal is to maintain blood pressure under 140/90. - Continue lifestyle modifications including low salt diet - Follow up in 6 months for blood pressure recheck and baseline labs  Hypothyroidism Hypothyroidism is well-managed. Last TSH and thyroid  panel were within normal limits. - Continue levothyroxine  100 mcg daily  Hyperlipidemia Hyperlipidemia is well-controlled. Last lipid panel in May showed LDL at 60. - Continue atorvastatin  20 mg daily  Status post left knee meniscus root repair Reports significant improvement in knee pain and functionality. Surgical intervention involved sutures and a peg for a meniscus root tear. Recovery is progressing well.        Subjective:   Encounter date: 08/30/2023  Mathew Atkins is a 68 y.o. male who has Annual physical exam; Hypothyroidism; Mixed hyperlipidemia; Metabolic dysfunction-associated steatotic liver disease (MASLD); NSCLC without metastasis (HCC); Multilevel degenerative disc disease; Prediabetes; S/P lateral meniscectomy of left knee; and Elevated blood pressure reading in office without diagnosis of hypertension on their problem list..   He  has a past medical history of Cancer (HCC) (2018), Hyperlipidemia (2022), and Thyroid  disease (2009 onset).SABRA   He presents with chief complaint of Hypertension (1 month follow up. ) .   Discussed the use of AI scribe software for clinical note transcription with the patient, who gave verbal consent to proceed.  History of Present Illness Mathew Atkins is a 68 year old male who presents for blood pressure follow-up.  His blood pressure was previously mildly elevated at 134/96, but today it has  improved to 122/84. He is managing his blood pressure with lifestyle modifications and is not taking any medications for it. He attributes the improvement to stopping Aleve two weeks ago, which he had been taking for knee pain.  He has a history of hypothyroidism and takes levothyroxine  100 mcg daily. His last TSH and thyroid  panel were within normal limits.  He also has hyperlipidemia and takes atorvastatin  20 mg daily. His last lipid panel in May showed well-controlled levels with an LDL of 60.  He recently had knee surgery for a root tear of the meniscus, which involved placing sutures and a peg. He has been experiencing low activity levels since his knee operation.  No chest pain, shortness of breath, headaches, or blurred vision. He is not currently taking tadalafil  for erectile dysfunction, as he forgot the prescription when his wife was out of town.     ROS  Past Surgical History:  Procedure Laterality Date   TONSILLECTOMY      Outpatient Medications Prior to Visit  Medication Sig Dispense Refill   atorvastatin  (LIPITOR) 20 MG tablet Take 1 tablet (20 mg total) by mouth daily. 90 tablet 3   levothyroxine  (SYNTHROID ) 100 MCG tablet Take 1 tablet (100 mcg total) by mouth daily before breakfast. 90 tablet 3   naproxen sodium (ALEVE) 220 MG tablet      tadalafil  (CIALIS ) 5 MG tablet Take 1 tablet (5 mg total) by mouth daily as needed for erectile dysfunction. 30 tablet 11   No facility-administered medications prior to visit.    Family History  Problem Relation Age of Onset   Cancer Mother    Diabetes Mother    Arthritis Father    COPD Father  Social History   Socioeconomic History   Marital status: Married    Spouse name: Not on file   Number of children: Not on file   Years of education: Not on file   Highest education level: Bachelor's degree (e.g., BA, AB, BS)  Occupational History   Not on file  Tobacco Use   Smoking status: Never    Passive exposure:  Never   Smokeless tobacco: Never   Tobacco comments:    Never a smoker.  Substance and Sexual Activity   Alcohol use: Yes    Alcohol/week: 8.0 standard drinks of alcohol    Types: 6 Cans of beer, 2 Shots of liquor per week    Comment: Weekends   Drug use: Never   Sexual activity: Yes    Birth control/protection: Coitus interruptus  Other Topics Concern   Not on file  Social History Narrative   Not on file   Social Drivers of Health   Financial Resource Strain: Low Risk  (08/29/2023)   Overall Financial Resource Strain (CARDIA)    Difficulty of Paying Living Expenses: Not hard at all  Food Insecurity: No Food Insecurity (08/29/2023)   Hunger Vital Sign    Worried About Running Out of Food in the Last Year: Never true    Ran Out of Food in the Last Year: Never true  Transportation Needs: No Transportation Needs (08/29/2023)   PRAPARE - Administrator, Civil Service (Medical): No    Lack of Transportation (Non-Medical): No  Physical Activity: Sufficiently Active (08/29/2023)   Exercise Vital Sign    Days of Exercise per Week: 3 days    Minutes of Exercise per Session: 50 min  Stress: No Stress Concern Present (08/29/2023)   [employer] of Occupational Health - Occupational Stress Questionnaire    Feeling of Stress: Not at all  Social Connections: Unknown (08/29/2023)   Social Connection and Isolation Panel    Frequency of Communication with Friends and Family: Patient declined    Frequency of Social Gatherings with Friends and Family: Patient declined    Attends Religious Services: Patient declined    Active Member of Clubs or Organizations: No    Attends Banker Meetings: Not on file    Marital Status: Married  Intimate Partner Violence: Not At Risk (05/06/2021)   Received from [facility] visits prior to 03/20/2022.   Humiliation, Afraid, Rape, and Kick questionnaire    Within the last year, have you been afraid of your partner  or ex-partner?: No    Within the last year, have you been humiliated or emotionally abused in other ways by your partner or ex-partner?: No    Within the last year, have you been kicked, hit, slapped, or otherwise physically hurt by your partner or ex-partner?: No    Within the last year, have you been raped or forced to have any kind of sexual activity by your partner or ex-partner?: No  Objective:  Physical Exam: BP 122/84 (BP Location: Left Arm, Patient Position: Sitting, Cuff Size: Large)   Pulse 67   Temp (!) 97.1 F (36.2 C) (Temporal)   Resp 18   Wt 213 lb 9.6 oz (96.9 kg)   SpO2 98%   BMI 29.17 kg/m   Wt Readings from Last 3 Encounters:  08/30/23 213 lb 9.6 oz (96.9 kg)  05/27/23 210 lb 9.6 oz (95.5 kg)  04/05/23 216 lb 9.6 oz (98.2 kg)   BP Readings from Last 3 Encounters:  08/30/23 122/84  05/27/23 (!) 134/96  04/05/23 136/88    Physical Exam VITALS: BP- 122/84 GENERAL: Alert, cooperative, well developed, no acute distress HEENT: Normocephalic, normal oropharynx, moist mucous membranes CHEST: Clear to auscultation bilaterally, no wheezes, rhonchi, or crackles CARDIOVASCULAR: Normal heart rate and rhythm, S1 and S2 normal without murmurs ABDOMEN: Soft, non-tender, non-distended, without organomegaly, normal bowel sounds EXTREMITIES: No cyanosis or edema NEUROLOGICAL: Cranial nerves grossly intact, moves all extremities without gross motor or sensory deficit   Physical Exam  No results found.  No results found for this or any previous visit (from the past 2160 hours).      Beverley Adine Hummer, MD, MS

## 2023-10-21 ENCOUNTER — Encounter: Admitting: Family Medicine

## 2023-11-16 ENCOUNTER — Ambulatory Visit: Admitting: Family Medicine

## 2023-12-06 ENCOUNTER — Ambulatory Visit: Admitting: Family Medicine

## 2023-12-06 ENCOUNTER — Encounter: Payer: Self-pay | Admitting: Family Medicine

## 2023-12-06 VITALS — BP 104/72 | HR 76 | Temp 98.2°F | Ht 71.0 in | Wt 215.6 lb

## 2023-12-06 DIAGNOSIS — Z1211 Encounter for screening for malignant neoplasm of colon: Secondary | ICD-10-CM

## 2023-12-06 DIAGNOSIS — Z23 Encounter for immunization: Secondary | ICD-10-CM | POA: Diagnosis not present

## 2023-12-06 DIAGNOSIS — Z860100 Personal history of colon polyps, unspecified: Secondary | ICD-10-CM

## 2023-12-06 DIAGNOSIS — Z136 Encounter for screening for cardiovascular disorders: Secondary | ICD-10-CM

## 2023-12-06 DIAGNOSIS — K760 Fatty (change of) liver, not elsewhere classified: Secondary | ICD-10-CM

## 2023-12-06 DIAGNOSIS — E039 Hypothyroidism, unspecified: Secondary | ICD-10-CM

## 2023-12-06 DIAGNOSIS — E782 Mixed hyperlipidemia: Secondary | ICD-10-CM

## 2023-12-06 DIAGNOSIS — R7303 Prediabetes: Secondary | ICD-10-CM

## 2023-12-06 DIAGNOSIS — R3912 Poor urinary stream: Secondary | ICD-10-CM

## 2023-12-06 DIAGNOSIS — N529 Male erectile dysfunction, unspecified: Secondary | ICD-10-CM

## 2023-12-06 DIAGNOSIS — Z131 Encounter for screening for diabetes mellitus: Secondary | ICD-10-CM

## 2023-12-06 DIAGNOSIS — Z125 Encounter for screening for malignant neoplasm of prostate: Secondary | ICD-10-CM

## 2023-12-06 DIAGNOSIS — E291 Testicular hypofunction: Secondary | ICD-10-CM

## 2023-12-06 DIAGNOSIS — Z0000 Encounter for general adult medical examination without abnormal findings: Secondary | ICD-10-CM

## 2023-12-06 DIAGNOSIS — N429 Disorder of prostate, unspecified: Secondary | ICD-10-CM

## 2023-12-06 DIAGNOSIS — E6609 Other obesity due to excess calories: Secondary | ICD-10-CM

## 2023-12-06 DIAGNOSIS — Z0001 Encounter for general adult medical examination with abnormal findings: Secondary | ICD-10-CM | POA: Diagnosis not present

## 2023-12-06 DIAGNOSIS — H906 Mixed conductive and sensorineural hearing loss, bilateral: Secondary | ICD-10-CM

## 2023-12-06 NOTE — Progress Notes (Incomplete)
 Assessment  Assessment/Plan:  Assessment and Plan              There are no discontinued medications.  Patient Counseling(The following topics were reviewed and/or handout was given):  -Nutrition: Stressed importance of moderation in sodium/caffeine intake, saturated fat and cholesterol, caloric balance, sufficient intake of fresh fruits, vegetables, and fiber.  -Stressed the importance of regular exercise.   -Substance Abuse: Discussed cessation/primary prevention of tobacco, alcohol, or other drug use; driving or other dangerous activities under the influence; availability of treatment for abuse.   -Injury prevention: Discussed safety belts, safety helmets, smoke detector, smoking near bedding or upholstery.   -Sexuality: Discussed sexually transmitted diseases, partner selection, use of condoms, avoidance of unintended pregnancy and contraceptive alternatives.   -Dental health: Discussed importance of regular tooth brushing, flossing, and dental visits.  -Health maintenance and immunizations reviewed. Please refer to Health maintenance section.  No follow-ups on file.        Subjective:   Encounter date: 12/06/2023  No chief complaint on file.   Discussed the use of AI scribe software for clinical note transcription with the patient, who gave verbal consent to proceed.  History of Present Illness           Elevated blood pressure - Managed with lifestyle modifications including low salt diet  Hypothyroidism - Managed with levothyroxine  100 mcg daily. - Last TSH within normal limits.    Hyperlipidemia - Managed with atorvastatin  20 mg daily. - Last lipid panel in May was normal.      08/30/2023    1:55 PM 05/27/2023    8:24 AM 04/05/2023    1:42 PM  Depression screen PHQ 2/9  Decreased Interest 0 0 0  Down, Depressed, Hopeless 0 0 0  PHQ - 2 Score 0 0 0  Altered sleeping  0 0  Tired, decreased energy  0 0  Change in appetite  0 0  Feeling bad or  failure about yourself   0 0  Trouble concentrating  0 0  Moving slowly or fidgety/restless  0 0  Suicidal thoughts  0 0  PHQ-9 Score  0  0   Difficult doing work/chores  Not difficult at all Not difficult at all     Data saved with a previous flowsheet row definition       05/27/2023    8:24 AM 04/05/2023    1:42 PM  GAD 7 : Generalized Anxiety Score  Nervous, Anxious, on Edge 0 0  Control/stop worrying 0 0  Worry too much - different things 0 0  Trouble relaxing 0 0  Restless 0 0  Easily annoyed or irritable 0 0  Afraid - awful might happen 0 0  Total GAD 7 Score 0 0  Anxiety Difficulty Not difficult at all Not difficult at all    Health Maintenance Due  Topic Date Due   Medicare Annual Wellness (AWV)  Never done   Influenza Vaccine  08/19/2023   COVID-19 Vaccine (4 - 2025-26 season) 09/19/2023     Dental: ***UTD Vision: ***UTD  PMH:  The following were reviewed and entered/updated in epic: Past Medical History:  Diagnosis Date   Cancer (HCC) 2018   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   Hyperlipidemia 2022   Controlled with Atorvastatin    Thyroid  disease 2009 onset   Taking levothyroxine     Patient Active Problem List   Diagnosis Date Noted   S/P lateral meniscectomy of left knee  08/30/2023   Elevated blood pressure reading in office without diagnosis of hypertension 08/30/2023   NSCLC without metastasis (HCC) 04/05/2023   Multilevel degenerative disc disease 04/05/2023   Prediabetes 04/05/2023   Annual physical exam 09/26/2011   Hypothyroidism 09/26/2011   Mixed hyperlipidemia 09/26/2011   Metabolic dysfunction-associated steatotic liver disease (MASLD) 09/26/2011    Past Surgical History:  Procedure Laterality Date   TONSILLECTOMY      Family History  Problem Relation Age of Onset   Cancer Mother    Diabetes Mother    Arthritis Father    COPD Father     Medications- reviewed and updated Outpatient Medications Prior to  Visit  Medication Sig Dispense Refill   atorvastatin  (LIPITOR) 20 MG tablet Take 1 tablet (20 mg total) by mouth daily. 90 tablet 3   levothyroxine  (SYNTHROID ) 100 MCG tablet Take 1 tablet (100 mcg total) by mouth daily before breakfast. 90 tablet 3   No facility-administered medications prior to visit.     No Known Allergies  Social History   Socioeconomic History   Marital status: Married    Spouse name: Not on file   Number of children: Not on file   Years of education: Not on file   Highest education level: Bachelor's degree (e.g., BA, AB, BS)  Occupational History   Not on file  Tobacco Use   Smoking status: Never    Passive exposure: Never   Smokeless tobacco: Never   Tobacco comments:    Never a smoker.  Substance and Sexual Activity   Alcohol use: Yes    Alcohol/week: 8.0 standard drinks of alcohol    Types: 6 Cans of beer, 2 Shots of liquor per week    Comment: Weekends   Drug use: Never   Sexual activity: Yes    Birth control/protection: Coitus interruptus  Other Topics Concern   Not on file  Social History Narrative   Not on file   Social Drivers of Health   Financial Resource Strain: Low Risk  (12/05/2023)   Overall Financial Resource Strain (CARDIA)    Difficulty of Paying Living Expenses: Not hard at all  Food Insecurity: No Food Insecurity (12/05/2023)   Hunger Vital Sign    Worried About Running Out of Food in the Last Year: Never true    Ran Out of Food in the Last Year: Never true  Transportation Needs: No Transportation Needs (12/05/2023)   PRAPARE - Administrator, Civil Service (Medical): No    Lack of Transportation (Non-Medical): No  Physical Activity: Sufficiently Active (12/05/2023)   Exercise Vital Sign    Days of Exercise per Week: 4 days    Minutes of Exercise per Session: 40 min  Stress: No Stress Concern Present (12/05/2023)   [employer] of Occupational Health - Occupational Stress Questionnaire    Feeling of  Stress: Not at all  Social Connections: Unknown (12/05/2023)   Social Connection and Isolation Panel    Frequency of Communication with Friends and Family: Patient declined    Frequency of Social Gatherings with Friends and Family: Patient declined    Attends Religious Services: Patient declined    Database Administrator or Organizations: No    Attends Engineer, Structural: Not on file    Marital Status: Married           Objective:  Physical Exam: There were no vitals taken for this visit.  There is no height or weight on file to calculate BMI. Wt  Readings from Last 3 Encounters:  08/30/23 213 lb 9.6 oz (96.9 kg)  05/27/23 210 lb 9.6 oz (95.5 kg)  04/05/23 216 lb 9.6 oz (98.2 kg)    Physical Exam          Physical Exam      Prior labs:   No results found for this or any previous visit (from the past 2160 hours).  Lab Results  Component Value Date   CHOL 119 05/27/2023   CHOL 134 12/10/2011   CHOL 151 09/24/2011   Lab Results  Component Value Date   HDL 41.70 05/27/2023   HDL 34 (L) 12/10/2011   HDL 31 (L) 09/24/2011   Lab Results  Component Value Date   LDLCALC 60 05/27/2023   LDLCALC 71 12/10/2011   LDLCALC 69 09/24/2011   Lab Results  Component Value Date   TRIG 86.0 05/27/2023   TRIG 144 12/10/2011   TRIG 256 (H) 09/24/2011   Lab Results  Component Value Date   CHOLHDL 3 05/27/2023   CHOLHDL 3.9 12/10/2011   CHOLHDL 4.9 09/24/2011   No results found for: LDLDIRECT  Last metabolic panel Lab Results  Component Value Date   GLUCOSE 105 (H) 05/27/2023   NA 139 05/27/2023   K 4.4 05/27/2023   CL 102 05/27/2023   CO2 29 05/27/2023   BUN 16 05/27/2023   CREATININE 1.01 05/27/2023   GFR 78.09 05/27/2023   CALCIUM  9.5 05/27/2023   PROT 7.1 05/27/2023   ALBUMIN 4.5 05/27/2023   BILITOT 1.3 (H) 05/27/2023   ALKPHOS 70 05/27/2023   AST 19 05/27/2023   ALT 23 05/27/2023   ANIONGAP 8 09/01/2016    Lab Results  Component Value  Date   HGBA1C 6.1 05/27/2023    Last CBC Lab Results  Component Value Date   WBC 4.4 05/27/2023   HGB 15.5 05/27/2023   HCT 47.4 05/27/2023   MCV 89.4 05/27/2023   MCH 29.5 09/01/2016   RDW 15.2 05/27/2023   PLT 233.0 05/27/2023    Lab Results  Component Value Date   TSH 1.80 05/27/2023    Lab Results  Component Value Date   PSA 0.72 05/27/2023   PSA 0.39 09/24/2011    Last vitamin D  No results found for: MARIEN BOLLS, VD25OH  Lab Results  Component Value Date   BILIRUBINUR NEGATIVE 09/01/2016   PROTEINUR NEGATIVE 05/27/2023   UROBILINOGEN 0.2 09/24/2011   LEUKOCYTESUR TRACE (A) 09/01/2016    Lab Results  Component Value Date   MICROALBUR <0.7 05/27/2023     At today's visit, we discussed treatment options, associated risk and benefits, and engage in counseling as needed.  Additionally the following were reviewed: Past medical records, past medical and surgical history, family and social background, as well as relevant laboratory results, imaging findings, and specialty notes, where applicable.  This message was generated using dictation software, and as a result, it may contain unintentional typos or errors.  Nevertheless, extensive effort was made to accurately convey at the pertinent aspects of the patient visit.    There may have been are other unrelated non-urgent complaints, but due to the busy schedule and the amount of time already spent with him, time does not permit to address these issues at today's visit. Another appointment may have or has been requested to review these additional issues.     Beverley KATHEE Hummer, MD  I,Emily Lagle,acting as a scribe for Beverley KATHEE Hummer, MD.,have documented all relevant documentation on the behalf of Beverley  KATHEE Hummer, MD.  I, Beverley KATHEE Hummer, MD, have reviewed all documentation for this visit. The documentation on 12/06/2023 for the exam, diagnosis, procedures, and orders are all accurate and  complete.

## 2023-12-06 NOTE — Progress Notes (Unsigned)
 Chief Complaint  Patient presents with   Medical Management of Chronic Issues    Pt presents for welsome to medicare      Subjective:   Mathew Atkins is a 68 y.o. male who presents for a Welcome to Medicare Exam.   Allergies (verified) Patient has no known allergies.   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   Hyperlipidemia 2022   Controlled with Atorvastatin    Thyroid  disease 2009 onset   Taking levothyroxine    Past Surgical History:  Procedure Laterality Date   MENISCUS REPAIR Left 06/29/2023   Left Knee   TONSILLECTOMY     Family History  Problem Relation Age of Onset   Cancer Mother    Diabetes Mother    Arthritis Father    COPD Father    Social History   Occupational History   Not on file  Tobacco Use   Smoking status: Never    Passive exposure: Never   Smokeless tobacco: Never   Tobacco comments:    Never a smoker.  Substance and Sexual Activity   Alcohol use: Yes    Alcohol/week: 8.0 standard drinks of alcohol    Types: 6 Cans of beer, 2 Shots of liquor per week    Comment: Weekends   Drug use: Never   Sexual activity: Yes    Birth control/protection: Coitus interruptus   Tobacco Counseling Counseling given: Not Answered Tobacco comments: Never a smoker.  SDOH Screenings   Food Insecurity: No Food Insecurity (12/06/2023)  Housing: Low Risk  (12/06/2023)  Transportation Needs: No Transportation Needs (12/06/2023)  Utilities: Low Risk  (12/28/2022)   Received from [facility]  Alcohol Screen: Low Risk  (12/06/2023)  Depression (PHQ2-9): Low Risk  (12/06/2023)  Financial Resource Strain: Low Risk  (12/05/2023)  Physical Activity: Sufficiently Active (12/05/2023)  Social Connections: Unknown (12/06/2023)  Stress: No Stress Concern Present (12/05/2023)  Tobacco Use: Low Risk  (12/06/2023)  Health Literacy: Adequate Health Literacy (12/06/2023)    See flowsheets for full screening details  Depression Screen PHQ 2 & 9 Depression Scale- Over the past 2 weeks, how often have you been bothered by any of the following problems? Little interest or pleasure in doing things: 0 Feeling down, depressed, or hopeless (PHQ Adolescent also includes...irritable): 0 PHQ-2 Total Score: 0 Trouble falling or staying asleep, or sleeping too much: 0 Feeling tired or having little energy: 0 Poor appetite or overeating (PHQ Adolescent also includes...weight loss): 0 Feeling bad about yourself - or that you are a failure or have let yourself or your family down: 0 Trouble concentrating on things, such as reading the newspaper or watching television (PHQ Adolescent also includes...like school work): 0 Moving or speaking so slowly that other people could have noticed. Or the opposite - being so fidgety or restless that you have been moving around a lot more than usual: 0 Thoughts that you would be better off dead, or of hurting yourself in some way: 0 PHQ-9 Total Score: 0 If you checked off any problems, how difficult have these problems made it for you to do your work, take care of things at home, or get along with other people?: Not difficult at all      Goals Addressed             This Visit's Progress    Weight (lb) < 200 lb (90.7 kg)  215 lb 9.6 oz (97.8 kg)    Patient would like to lose 10 pounds in 6 months       Visit info / Clinical Intake: Medicare Wellness Visit Type:: Welcome 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) Persons participating in visit:: patient Medicare Wellness Visit Mode:: In-person (required for WTM) Information given by:: patient Interpreter Needed?: No  Functional Status Activities of Daily Living (to include ambulation/medication): (Patient-Rptd) Independent Ambulation: (Patient-Rptd) Independent Home Management: (Patient-Rptd) Independent  Fall Screening Falls in the past year?: (Patient-Rptd) 0 Number of falls in past year:  0 Was there an injury with Fall?: 0 Fall Risk Category Calculator: 0 Patient Fall Risk Level: Low Fall Risk  Fall Risk Patient at Risk for Falls Due to: No Fall Risks Fall risk Follow up: Falls evaluation completed  Cognitive Assessment What year is it?: 0 points What month is it?: 0 points Give patient an address phrase to remember (5 components): [address] About what time is it?: 0 points Count backwards from 20 to 1: 0 points Say the months of the year in reverse: 0 points Repeat the address phrase from earlier: 0 points 6 CIT Score: 0 points    Discussed the use of AI scribe software for clinical note transcription with the patient, who gave verbal consent to proceed.  History of Present Illness Mathew Atkins is a 68 year old male who presents for his initial welcome to Medicare visit.  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   Physical Exam Constitutional:      General: He is not in acute distress.    Appearance: Normal appearance. He is not ill-appearing or toxic-appearing.  HENT:     Head: Normocephalic and atraumatic.     Right Ear: Hearing, tympanic membrane, ear canal and external ear normal. There is no impacted cerumen.     Left Ear: Hearing, tympanic membrane, ear canal and external ear normal. There is no impacted cerumen.     Nose: Nose normal. No congestion.     Mouth/Throat:     Lips: No lesions.     Mouth: Mucous membranes are moist.     Pharynx: Oropharynx is clear. No oropharyngeal exudate.  Eyes:     General: No scleral icterus.       Right eye: No discharge.        Left eye: No discharge.     Conjunctiva/sclera: Conjunctivae normal.     Pupils: Pupils are  equal, round, and reactive to light.  Neck:     Thyroid : No thyroid  mass, thyromegaly or thyroid  tenderness.  Cardiovascular:     Rate and Rhythm: Normal rate and regular rhythm.     Pulses: Normal pulses.     Heart sounds: Normal heart sounds.  Pulmonary:     Effort: Pulmonary effort is normal. No respiratory distress.     Breath sounds: Normal breath sounds.  Abdominal:     General: Abdomen is flat. Bowel sounds are normal.     Palpations: Abdomen is soft.  Musculoskeletal:        General: Normal range of motion.     Cervical back: Normal range of motion.     Right lower leg: No edema.     Left lower leg: No edema.  Lymphadenopathy:     Cervical: No cervical adenopathy.  Skin:    General: Skin is warm and dry.     Findings: No rash.  Neurological:     General: No focal deficit present.     Mental Status: He is alert and oriented to person, place, and time. Mental status is at baseline.     Deep Tendon Reflexes:     Reflex Scores:      Patellar reflexes are 2+ on the right side and 2+ on the left side. Psychiatric:        Mood and Affect: Mood normal.        Behavior: Behavior normal.        Thought Content:  Thought content normal.        Judgment: Judgment normal.   {(optional), or other factors deemed appropriate based on the beneficiary's medical and social history and current clinical standards. - TEXT BETWEEN BRACKETS WILL DISAPPEAR WHEN YOU SIGN THE ENCOUNTER:1}   Current Medications (verified) Outpatient Encounter Medications as of 12/06/2023  Medication Sig   atorvastatin  (LIPITOR) 20 MG tablet Take 1 tablet (20 mg total) by mouth daily.   levothyroxine  (SYNTHROID ) 100 MCG tablet Take 1 tablet (100 mcg total) by mouth daily before breakfast.   mupirocin ointment (BACTROBAN) 2 % Apply 1 Application topically as needed.   No facility-administered encounter medications on file as of 12/06/2023.   Hearing/Vision screen Hearing Screening   500Hz  1000Hz  2000Hz  4000Hz   Right ear Fail Pass Fail Pass  Left ear Fail Pass Fail Fail   Vision Screening   Right eye Left eye Both eyes  Without correction     With correction 20/15 20/15 20/10    Immunizations and Health Maintenance Health Maintenance  Topic Date Due   COVID-19 Vaccine (5 - Mixed Product risk 2025-26 season) 05/11/2024   Medicare Annual Wellness (AWV)  12/05/2024   Colonoscopy  06/25/2029   DTaP/Tdap/Td (3 - Td or Tdap) 04/03/2030   Pneumococcal Vaccine: 50+ Years  Completed   Influenza Vaccine  Completed   Hepatitis C Screening  Completed   HIV Screening  Completed   Zoster Vaccines- Shingrix  Completed   Hepatitis B Vaccines 19-59 Average Risk  Aged Out   Meningococcal B Vaccine  Aged Out    EKG: normal EKG, normal sinus rhythm, there are no previous tracings available for comparison     Assessment/Plan:  This is a routine wellness examination for [facility].  Patient Care Team: Sebastian Beverley NOVAK, MD as PCP - General (Family Medicine)  I have personally reviewed and noted the following in the patient's chart:   Medical and social history Use of alcohol, tobacco or illicit drugs  Current  medications and supplements  including opioid prescriptions. Functional ability and status Nutritional status Physical activity Advanced directives List of other physicians Hospitalizations, surgeries, and ER visits in previous 12 months Vitals Screenings to include cognitive, depression, and falls Referrals and appointments  Orders Placed This Encounter  Procedures   PSA   Lipid panel   Hemoglobin A1c   Hepatic function panel   Basic Metabolic Panel with GFR   Ambulatory referral to Gastroenterology (for Colonoscopy)    Referral Priority:   Routine    Referral Type:   Consultation    Referral Reason:   Specialty Services Required    Number of Visits Requested:   1   Ambulatory referral to Audiology    Referral Priority:   Routine    Referral Type:   Audiology Exam    Referral Reason:   Specialty Services Required    Number of Visits Requested:   1   POCT urinalysis dipstick   EKG 12-Lead   In addition, I have reviewed and discussed with patient certain preventive protocols, quality metrics, and best practice recommendations. A written personalized care plan for preventive services as well as general preventive health recommendations were provided to patient.   Beverley KATHEE Hummer, MD   12/06/2023   Return in 6 months (on 06/04/2024).

## 2023-12-06 NOTE — Patient Instructions (Signed)
 Mathew Atkins,  Thank you for taking the time for your Medicare Wellness Visit. I appreciate your continued commitment to your health goals. Please review the care plan we discussed, and feel free to reach out if I can assist you further.  Please note that Annual Wellness Visits do not include a physical exam. Some assessments may be limited, especially if the visit was conducted virtually. If needed, we may recommend an in-person follow-up with your provider.  Ongoing Care Seeing your primary care provider every 3 to 6 months helps us  monitor your health and provide consistent, personalized care.   Referrals If a referral was made during today's visit and you haven't received any updates within two weeks, please contact the referred provider directly to check on the status.  Recommended Screenings:  Health Maintenance  Topic Date Due   COVID-19 Vaccine (5 - Mixed Product risk 2025-26 season) 05/11/2024   Medicare Annual Wellness Visit  12/05/2024   Colon Cancer Screening  06/25/2029   DTaP/Tdap/Td vaccine (3 - Td or Tdap) 04/03/2030   Pneumococcal Vaccine for age over 48  Completed   Flu Shot  Completed   Hepatitis C Screening  Completed   HIV Screening  Completed   Zoster (Shingles) Vaccine  Completed   Hepatitis B Vaccine  Aged Out   Meningitis B Vaccine  Aged Out       09/01/2016    3:11 AM  Advanced Directives  Does Patient Have a Medical Advance Directive? No   Would patient like information on creating a medical advance directive? No - Patient declined      Data saved with a previous flowsheet row definition    Vision: Annual vision screenings are recommended for early detection of glaucoma, cataracts, and diabetic retinopathy. These exams can also reveal signs of chronic conditions such as diabetes and high blood pressure.  Dental: Annual dental screenings help detect early signs of oral cancer, gum disease, and other conditions linked to overall health, including heart  disease and diabetes.  Please see the attached documents for additional preventive care recommendations.    Screening for Type 2 Diabetes  A screening test for type 2 diabetes (type 2 diabetes mellitus) is a blood test to measure your blood sugar (glucose) level. This test is done to check for early signs of diabetes, before you develop symptoms.  Type 2 diabetes is a long-term (chronic) disease. In type 2 diabetes, one or both of these problems may be present: The pancreas does not make enough of a hormone called insulin. Cells in the body do not respond properly to insulin that the body makes (insulin resistance). Normally, insulin allows blood sugar (glucose) to enter cells in the body. The cells use glucose for energy. Insulin resistance or lack of insulin causes excess glucose to build up in the blood instead of going into cells. This results in high blood glucose levels (hyperglycemia), which can cause many complications. You may be screened for type 2 diabetes as part of your regular health care, especially if you have a high risk for diabetes. Screening can help to identify type 2 diabetes at its early stage (prediabetes). Identifying and treating prediabetes may delay or prevent the development of type 2 diabetes. Tell a health care provider about: All medicines you are taking, including vitamins, herbs, eye drops, creams, and over-the-counter medicines. Any bleeding problems you have. Any medical conditions you have. Whether you are pregnant or may be pregnant. Who should be screened for type 2 diabetes? Adults  Adults age 39 and older. These adults should be screened once every three years. Adults who are any age, are overweight, and have one other risk factor. These adults should be screened once every three years. Adults who have normal blood glucose levels and two or more risk factors. These adults may be screened once every year (annually). Women who have had gestational diabetes  in the past. These women should be screened once every three years. Pregnant women who have risk factors. These women should be screened at their first prenatal visit and again between weeks 24 and 28 of pregnancy. Children and adolescents Children and adolescents should be screened for type 2 diabetes if they are overweight and have any of the following risk factors: A family history of type 2 diabetes. Being a member of a high-risk ethnic group. Signs of insulin resistance or conditions that are associated with insulin resistance. A mother who had gestational diabetes while pregnant. Screening should be done at least once every three years, starting at age 77 or at the onset of puberty, whichever comes first. Your health care provider or your child's health care provider may recommend having a screening more or less often. What are the risk factors for type 2 diabetes? The following are factors that may make you more likely to develop type 2 diabetes and can be modified: Not getting enough exercise. Having high blood pressure. Having low levels of good cholesterol (HDL-C) or high levels of blood fats (triglycerides). Having high blood glucose in a previous blood test. Being overweight or obese. The following are factors that may make you more likely to develop type 2 diabetes and can not be modified: Having a parent or sibling (first-degree relative) who has diabetes. Being of American-Indian, African-American, Hispanic/Latino, Asian, or Pacific Islander descent. Being older than age 2. Having a history of diabetes during pregnancy (gestational diabetes). Having certain diseases or conditions that may be caused by insulin resistance, including: Acanthosis nigricans. This is a condition that causes dark skin on the neck, armpits, and groin. Polycystic ovary syndrome (PCOS). Cardiovascular heart disease. What happens during screening? During screening, your health care provider may ask  questions about: Your health and your risk factors, including your activity level and any medical conditions that you have. The health of your first-degree relatives. Past pregnancies, if this applies. Your health care provider will also do a physical exam, including a blood pressure measurement and blood tests. There are four blood tests that can be used to screen for type 2 diabetes. You may have one or more of the following: A fasting blood glucose (FBG) test. You will not be allowed to eat (you will fast) for 8 hours or more before a blood sample is taken. A random blood glucose test. This test checks your blood glucose at any time of the day regardless of when you ate. An oral glucose tolerance test (OGTT). This test measures your blood glucose at two times: After you have not eaten (have fasted) overnight. This is your baseline glucose level. Two hours after you drink a glucose-containing beverage. An A1C (hemoglobin A1C) blood test. This test provides information about blood glucose control over the previous 2-3 months. What do the results mean? Your test results are a measurement of how much glucose is in your blood. Normal blood glucose levels mean that you do not have diabetes or prediabetes. High blood glucose levels may mean that you have prediabetes or diabetes. Depending on the results, other tests may be needed  to confirm the diagnosis. You may be diagnosed with type 2 diabetes if: Your FBG level is 126 mg/dL (7.0 mmol/L) or higher. Your random blood glucose level is 200 mg/dL (88.8 mmol/L) or higher. Your A1C level is 6.5% or higher. Your OGTT result is higher than 200 mg/dL (88.8 mmol/L). These blood tests may be repeated to confirm your diagnosis. Talk with your health care provider about what your results mean. Summary A screening test for type 2 diabetes (type 2 diabetes mellitus) is a blood test to measure your blood sugar (glucose) level. Know what your risk factors are for  developing type 2 diabetes. If you are at risk, get screening tests as often as told by your health care provider. Screening may help you identify type 2 diabetes at its early stage (prediabetes). Identifying and treating prediabetes may delay or prevent the development of type 2 diabetes. This information is not intended to replace advice given to you by your health care provider. Make sure you discuss any questions you have with your health care provider. Document Revised: 03/31/2020 Document Reviewed: 03/31/2020 Elsevier Patient Education  2024 [employer] Health Maintenance, Male Adopting a healthy lifestyle and getting preventive care are important in promoting health and wellness. Ask your health care provider about: The right schedule for you to have regular tests and exams. Things you can do on your own to prevent diseases and keep yourself healthy. What should I know about diet, weight, and exercise? Eat a healthy diet  Eat a diet that includes plenty of vegetables, fruits, low-fat dairy products, and lean protein. Do not eat a lot of foods that are high in solid fats, added sugars, or sodium. Maintain a healthy weight Body mass index (BMI) is a measurement that can be used to identify possible weight problems. It estimates body fat based on height and weight. Your health care provider can help determine your BMI and help you achieve or maintain a healthy weight. Get regular exercise Get regular exercise. This is one of the most important things you can do for your health. Most adults should: Exercise for at least 150 minutes each week. The exercise should increase your heart rate and make you sweat (moderate-intensity exercise). Do strengthening exercises at least twice a week. This is in addition to the moderate-intensity exercise. Spend less time sitting. Even light physical activity can be beneficial. Watch cholesterol and blood lipids Have your blood tested for lipids and  cholesterol at 66 years of age, then have this test every 5 years. You may need to have your cholesterol levels checked more often if: Your lipid or cholesterol levels are high. You are older than 66 years of age. You are at high risk for heart disease. What should I know about cancer screening? Many types of cancers can be detected early and may often be prevented. Depending on your health history and family history, you may need to have cancer screening at various ages. This may include screening for: Colorectal cancer. Prostate cancer. Skin cancer. Lung cancer. What should I know about heart disease, diabetes, and high blood pressure? Blood pressure and heart disease High blood pressure causes heart disease and increases the risk of stroke. This is more likely to develop in people who have high blood pressure readings or are overweight. Talk with your health care provider about your target blood pressure readings. Have your blood pressure checked: Every 3-5 years if you are 7-60 years of age. Every year if you are 60 years old  or older. If you are between the ages of 42 and 42 and are a current or former smoker, ask your health care provider if you should have a one-time screening for abdominal aortic aneurysm (AAA). Diabetes Have regular diabetes screenings. This checks your fasting blood sugar level. Have the screening done: Once every three years after age 59 if you are at a normal weight and have a low risk for diabetes. More often and at a younger age if you are overweight or have a high risk for diabetes. What should I know about preventing infection? Hepatitis B If you have a higher risk for hepatitis B, you should be screened for this virus. Talk with your health care provider to find out if you are at risk for hepatitis B infection. Hepatitis C Blood testing is recommended for: Everyone born from [birth year] through 1963. Anyone with known risk factors for hepatitis C. Sexually  transmitted infections (STIs) You should be screened each year for STIs, including gonorrhea and chlamydia, if: You are sexually active and are younger than 66 years of age. You are older than 66 years of age and your health care provider tells you that you are at risk for this type of infection. Your sexual activity has changed since you were last screened, and you are at increased risk for chlamydia or gonorrhea. Ask your health care provider if you are at risk. Ask your health care provider about whether you are at high risk for HIV. Your health care provider may recommend a prescription medicine to help prevent HIV infection. If you choose to take medicine to prevent HIV, you should first get tested for HIV. You should then be tested every 3 months for as long as you are taking the medicine. Follow these instructions at home: Alcohol use Do not drink alcohol if your health care provider tells you not to drink. If you drink alcohol: Limit how much you have to 0-2 drinks a day. Know how much alcohol is in your drink. In the [place] one drink equals one 12 oz bottle of beer (355 mL), one 5 oz glass of wine (148 mL), or one 1 oz glass of hard liquor (44 mL). Lifestyle Do not use any products that contain nicotine or tobacco. These products include cigarettes, chewing tobacco, and vaping devices, such as e-cigarettes. If you need help quitting, ask your health care provider. Do not use street drugs. Do not share needles. Ask your health care provider for help if you need support or information about quitting drugs. General instructions Schedule regular health, dental, and eye exams. Stay current with your vaccines. Tell your health care provider if: You often feel depressed. You have ever been abused or do not feel safe at home. Summary Adopting a healthy lifestyle and getting preventive care are important in promoting health and wellness. Follow your health care provider's instructions about  healthy diet, exercising, and getting tested or screened for diseases. Follow your health care provider's instructions on monitoring your cholesterol and blood pressure. This information is not intended to replace advice given to you by your health care provider. Make sure you discuss any questions you have with your health care provider. Document Revised: 05/26/2020 Document Reviewed: 05/26/2020 Elsevier Patient Education  2024 [employer] Hearing Loss Hearing loss is a partial or total loss of the ability to hear. This can be temporary or permanent, and it can happen in one or both ears. There are two types of hearing loss. You can have just  one type or both types. You may have a problem with: Damage to your hearing nerves (sensorineural hearing loss). This type of hearing loss is more likely to be permanent. A hearing aid is often the best treatment. Sound getting to your inner ear (conductive hearing loss). This type of hearing loss can usually be treated medically or surgically. Medical care is necessary to treat hearing loss properly and to prevent the condition from getting worse. Your hearing may partially or completely come back, depending on what caused your hearing loss and how severe it is. In some cases, hearing loss is permanent. What are the causes? Common causes of hearing loss include: Too much wax in the ear canal. Infection of the ear canal or middle ear. Fluid in the middle ear. Injury to the ear or surrounding area. An object stuck in the ear. A history of prolonged exposure to loud sounds, such as music. Less common causes of hearing loss include: Tumors in the ear. Viral or bacterial infections, such as meningitis. A hole in the eardrum (perforated eardrum). Problems with the hearing nerve that sends signals between the brain and the ear. Certain medicines. What are the signs or symptoms? Symptoms of this condition may include: Difficulty telling the difference  between sounds. Difficulty following a conversation when there is background noise. Lack of response to sounds in your environment. This may be most noticeable when you do not respond to startling sounds. Needing to turn up the volume on the television, radio, or other devices. Ringing in the ears. Dizziness. How is this diagnosed? This condition is diagnosed based on: A physical exam. A hearing test (audiometry). The test will be performed by a hearing specialist (audiologist). Imaging tests, such as an MRI or CT scan. You may also be referred to an ear, nose, and throat (ENT) specialist (otolaryngologist). How is this treated? Treatment for hearing loss may include: Earwax removal. Medicines to treat or prevent infection (antibiotics). Medicines to reduce inflammation (corticosteroids). Hearing aids or assistive listening devices. Follow these instructions at home: If you were prescribed an antibiotic medicine, take it as told by your health care provider. Do not stop taking the antibiotic even if you start to feel better. Take over-the-counter and prescription medicines only as told by your health care provider. Avoid loud noises. Use hearing protection if you are exposed to loud noises or work in a noisy environment. Return to your normal activities as told by your health care provider. Ask your health care provider what activities are safe for you. Keep all follow-up visits. This is important. Contact a health care provider if: You feel dizzy. You develop new symptoms. You vomit or feel nauseous. You have a fever. Get help right away if: You develop sudden changes in your vision. You have severe ear pain. You have new or increased weakness. You have a severe headache. Summary Hearing loss is a decreased ability to hear sounds around you. It can be temporary or permanent. Treatment will depend on the cause of your hearing loss. It may include earwax removal, medicines, or a  hearing aid. Your hearing may partially or completely come back, depending on what caused your hearing loss and how severe it is. Keep all follow-up visits. This is important. This information is not intended to replace advice given to you by your health care provider. Make sure you discuss any questions you have with your health care provider. Document Revised: 11/13/2020 Document Reviewed: 11/13/2020 Elsevier Patient Education  2024 [employer].

## 2023-12-07 LAB — POCT URINALYSIS DIPSTICK
Bilirubin, UA: NEGATIVE
Blood, UA: NEGATIVE
Glucose, UA: NEGATIVE
Ketones, UA: NEGATIVE
Leukocytes, UA: NEGATIVE
Nitrite, UA: NEGATIVE
Protein, UA: POSITIVE — AB
Spec Grav, UA: >=1.03 — AB (ref 1.010–1.025)
Urobilinogen, UA: 0.2 U/dL
pH, UA: 6 (ref 5.0–8.0)

## 2023-12-07 LAB — MICROALBUMIN / CREATININE URINE RATIO
Creatinine,U: 214.5 mg/dL
Microalb Creat Ratio: 4.5 mg/g (ref 0.0–30.0)
Microalb, Ur: 1 mg/dL (ref 0.0–1.9)

## 2023-12-07 LAB — URINALYSIS, MICROSCOPIC ONLY
RBC / HPF: NONE SEEN (ref 0–?)
WBC, UA: NONE SEEN (ref 0–?)

## 2023-12-08 NOTE — Progress Notes (Signed)
 Chief Complaint  Patient presents with   Medical Management of Chronic Issues    Pt presents for welcome to medicare      Subjective:   Mathew Atkins is a 68 y.o. male who presents for a Welcome to Medicare Exam.   Allergies (verified) Patient has no known allergies.   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   Hyperlipidemia 2022   Controlled with Atorvastatin    Thyroid  disease 2009 onset   Taking levothyroxine    Past Surgical History:  Procedure Laterality Date   MENISCUS REPAIR Left 06/29/2023   Left Knee   TONSILLECTOMY     Family History  Problem Relation Age of Onset   Cancer Mother    Diabetes Mother    Arthritis Father    COPD Father    Social History   Occupational History   Not on file  Tobacco Use   Smoking status: Never    Passive exposure: Never   Smokeless tobacco: Never   Tobacco comments:    Never a smoker.  Substance and Sexual Activity   Alcohol use: Yes    Alcohol/week: 8.0 standard drinks of alcohol    Types: 6 Cans of beer, 2 Shots of liquor per week    Comment: Weekends   Drug use: Never   Sexual activity: Yes    Birth control/protection: Coitus interruptus   Tobacco Counseling Counseling given: Not Answered Tobacco comments: Never a smoker.  SDOH Screenings   Food Insecurity: No Food Insecurity (12/06/2023)  Housing: Low Risk  (12/06/2023)  Transportation Needs: No Transportation Needs (12/06/2023)  Utilities: Low Risk  (12/28/2022)   Received from [facility]  Alcohol Screen: Low Risk  (12/06/2023)  Depression (PHQ2-9): Low Risk  (12/06/2023)  Financial Resource Strain: Low Risk  (12/05/2023)  Physical Activity: Sufficiently Active (12/05/2023)  Social Connections: Unknown (12/06/2023)  Stress: No Stress Concern Present (12/05/2023)  Tobacco Use: Low Risk  (12/06/2023)  Health Literacy: Adequate Health Literacy (12/06/2023)    See flowsheets for full screening details  Depression Screen PHQ 2 & 9 Depression Scale- Over the past 2 weeks, how often have you been bothered by any of the following problems? Little interest or pleasure in doing things: 0 Feeling down, depressed, or hopeless (PHQ Adolescent also includes...irritable): 0 PHQ-2 Total Score: 0 Trouble falling or staying asleep, or sleeping too much: 0 Feeling tired or having little energy: 0 Poor appetite or overeating (PHQ Adolescent also includes...weight loss): 0 Feeling bad about yourself - or that you are a failure or have let yourself or your family down: 0 Trouble concentrating on things, such as reading the newspaper or watching television (PHQ Adolescent also includes...like school work): 0 Moving or speaking so slowly that other people could have noticed. Or the opposite - being so fidgety or restless that you have been moving around a lot more than usual: 0 Thoughts that you would be better off dead, or of hurting yourself in some way: 0 PHQ-9 Total Score: 0 If you checked off any problems, how difficult have these problems made it for you to do your work, take care of things at home, or get along with other people?: Not difficult at all      Goals Addressed             This Visit's Progress    Weight (lb) < 200 lb (90.7 kg)  215 lb 9.6 oz (97.8 kg)    Patient would like to lose 10 pounds in 6 months       Visit info / Clinical Intake: Medicare Wellness Visit Type:: Welcome 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) Persons participating in visit:: patient Medicare Wellness Visit Mode:: In-person (required for WTM) Information given by:: patient Interpreter Needed?: No  Functional Status Activities of Daily Living (to include ambulation/medication): (Patient-Rptd) Independent Ambulation: (Patient-Rptd) Independent Home Management: (Patient-Rptd) Independent  Fall Screening Falls in the past year?: (Patient-Rptd) 0 Number of falls in past year:  0 Was there an injury with Fall?: 0 Fall Risk Category Calculator: 0 Patient Fall Risk Level: Low Fall Risk  Fall Risk Patient at Risk for Falls Due to: No Fall Risks Fall risk Follow up: Falls evaluation completed  Cognitive Assessment What year is it?: 0 points What month is it?: 0 points Give patient an address phrase to remember (5 components): [address] About what time is it?: 0 points Count backwards from 20 to 1: 0 points Say the months of the year in reverse: 0 points Repeat the address phrase from earlier: 0 points 6 CIT Score: 0 points    Discussed the use of AI scribe software for clinical note transcription with the patient, who gave verbal consent to proceed.  History of Present Illness Mathew Atkins is a 68 year old male who presents for his initial welcome to Medicare visit.  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   Physical Exam Constitutional:      General: He is not in acute distress.    Appearance: Normal appearance. He is not ill-appearing or toxic-appearing.  HENT:     Head: Normocephalic and atraumatic.     Right Ear: Hearing, tympanic membrane, ear canal and external ear normal. There is no impacted cerumen.     Left Ear: Hearing, tympanic membrane, ear canal and external ear normal. There is no impacted cerumen.     Nose: Nose normal. No congestion.     Mouth/Throat:     Lips: No lesions.     Mouth: Mucous membranes are moist.     Pharynx: Oropharynx is clear. No oropharyngeal exudate.  Eyes:     General: No scleral icterus.       Right eye: No discharge.        Left eye: No discharge.     Conjunctiva/sclera: Conjunctivae normal.     Pupils: Pupils are  equal, round, and reactive to light.  Neck:     Thyroid : No thyroid  mass, thyromegaly or thyroid  tenderness.  Cardiovascular:     Rate and Rhythm: Normal rate and regular rhythm.     Pulses: Normal pulses.     Heart sounds: Normal heart sounds.  Pulmonary:     Effort: Pulmonary effort is normal. No respiratory distress.     Breath sounds: Normal breath sounds.  Abdominal:     General: Abdomen is flat. Bowel sounds are normal.     Palpations: Abdomen is soft.  Musculoskeletal:        General: Normal range of motion.     Cervical back: Normal range of motion.     Right lower leg: No edema.     Left lower leg: No edema.  Lymphadenopathy:     Cervical: No cervical adenopathy.  Skin:    General: Skin is warm and dry.     Findings: No rash.  Neurological:     General: No focal deficit present.     Mental Status: He is alert and oriented to person, place, and time. Mental status is at baseline.     Deep Tendon Reflexes:     Reflex Scores:      Patellar reflexes are 2+ on the right side and 2+ on the left side. Psychiatric:        Mood and Affect: Mood normal.        Behavior: Behavior normal.        Thought Content:  Thought content normal.        Judgment: Judgment normal.      Current Medications (verified) Outpatient Encounter Medications as of 12/06/2023  Medication Sig   atorvastatin  (LIPITOR) 20 MG tablet Take 1 tablet (20 mg total) by mouth daily.   levothyroxine  (SYNTHROID ) 100 MCG tablet Take 1 tablet (100 mcg total) by mouth daily before breakfast.   mupirocin ointment (BACTROBAN) 2 % Apply 1 Application topically as needed.   No facility-administered encounter medications on file as of 12/06/2023.   Hearing/Vision screen Hearing Screening   500Hz  1000Hz  2000Hz  4000Hz   Right ear Fail Pass Fail Pass  Left ear Fail Pass Fail Fail   Vision Screening   Right eye Left eye Both eyes  Without correction     With correction 20/15 20/15 20/10    Immunizations and Health Maintenance Health Maintenance  Topic Date Due   Hepatitis B Vaccines 19-59 Average Risk (2 of 2 - CpG 2-dose series) 01/03/2024   COVID-19 Vaccine (5 - Mixed Product risk 2025-26 season) 05/11/2024   Medicare Annual Wellness (AWV)  12/05/2024   Colonoscopy  06/25/2029   DTaP/Tdap/Td (3 - Td or Tdap) 04/03/2030   Pneumococcal Vaccine: 50+ Years  Completed   Influenza Vaccine  Completed   Hepatitis C Screening  Completed   HIV Screening  Completed   Zoster Vaccines- Shingrix  Completed   Meningococcal B Vaccine  Aged Out    EKG: normal EKG, normal sinus rhythm, there are no previous tracings available for comparison     Assessment/Plan:  This is a routine wellness examination for [facility].  Patient Care Team: Sebastian Beverley NOVAK, MD as PCP - General (Family Medicine)  I have personally reviewed and noted the following in the patient's chart:   Medical and social history Use of alcohol, tobacco or illicit drugs  Current medications and supplements including opioid prescriptions. Functional ability and status Nutritional status Physical activity Advanced directives List of other physicians Hospitalizations,  surgeries,  and ER visits in previous 12 months Vitals Screenings to include cognitive, depression, and falls Referrals and appointments  Orders Placed This Encounter  Procedures   Heplisav-B (HepB-CPG) Vaccine   Hepatitis A vaccine adult IM   PSA   TSH Rfx on Abnormal to Free T4    Standing Status:   Future    Expiration Date:   12/05/2024   Basic Metabolic Panel with GFR    Standing Status:   Future    Expiration Date:   12/05/2024   CBC with Differential/Platelet    Standing Status:   Future    Expiration Date:   12/05/2024   Apo A1 + B + Ratio   Testosterone ,Free and Total    Standing Status:   Future    Expiration Date:   12/05/2024   Hepatic function panel    Standing Status:   Future    Expiration Date:   12/05/2024   Hemoglobin A1c    Standing Status:   Future    Expiration Date:   12/05/2024   Lipid panel    Standing Status:   Future    Expiration Date:   12/05/2024   Microalbumin / creatinine urine ratio    Standing Status:   Future    Number of Occurrences:   1    Expiration Date:   12/05/2024   Urine Microscopic    Standing Status:   Future    Number of Occurrences:   1    Expiration Date:   12/05/2024   Ambulatory referral to Gastroenterology (for Colonoscopy)    Referral Priority:   Routine    Referral Type:   Consultation    Referral Reason:   Specialty Services Required    Number of Visits Requested:   1   Ambulatory referral to Audiology    Referral Priority:   Routine    Referral Type:   Audiology Exam    Referral Reason:   Specialty Services Required    Number of Visits Requested:   1   POCT urinalysis dipstick    Standing Status:   Future    Number of Occurrences:   1    Expiration Date:   12/05/2024   EKG 12-Lead   In addition, I have reviewed and discussed with patient certain preventive protocols, quality metrics, and best practice recommendations. A written personalized care plan for preventive services as well as general preventive  health recommendations were provided to patient.   Beverley KATHEE Hummer, MD   12/08/2023   Return in 6 months (on 06/04/2024).

## 2023-12-13 ENCOUNTER — Other Ambulatory Visit (INDEPENDENT_AMBULATORY_CARE_PROVIDER_SITE_OTHER)

## 2023-12-13 DIAGNOSIS — E039 Hypothyroidism, unspecified: Secondary | ICD-10-CM

## 2023-12-13 DIAGNOSIS — Z131 Encounter for screening for diabetes mellitus: Secondary | ICD-10-CM | POA: Diagnosis not present

## 2023-12-13 DIAGNOSIS — E291 Testicular hypofunction: Secondary | ICD-10-CM

## 2023-12-13 DIAGNOSIS — K760 Fatty (change of) liver, not elsewhere classified: Secondary | ICD-10-CM

## 2023-12-13 DIAGNOSIS — E782 Mixed hyperlipidemia: Secondary | ICD-10-CM | POA: Diagnosis not present

## 2023-12-13 DIAGNOSIS — R7303 Prediabetes: Secondary | ICD-10-CM

## 2023-12-13 LAB — CBC WITH DIFFERENTIAL/PLATELET
Basophils Absolute: 0 K/uL (ref 0.0–0.1)
Basophils Relative: 0.6 % (ref 0.0–3.0)
Eosinophils Absolute: 0.1 K/uL (ref 0.0–0.7)
Eosinophils Relative: 2.6 % (ref 0.0–5.0)
HCT: 47.3 % (ref 39.0–52.0)
Hemoglobin: 15.8 g/dL (ref 13.0–17.0)
Lymphocytes Relative: 33.8 % (ref 12.0–46.0)
Lymphs Abs: 1.8 K/uL (ref 0.7–4.0)
MCHC: 33.3 g/dL (ref 30.0–36.0)
MCV: 87.7 fl (ref 78.0–100.0)
Monocytes Absolute: 0.5 K/uL (ref 0.1–1.0)
Monocytes Relative: 8.8 % (ref 3.0–12.0)
Neutro Abs: 2.9 K/uL (ref 1.4–7.7)
Neutrophils Relative %: 54.2 % (ref 43.0–77.0)
Platelets: 235 K/uL (ref 150.0–400.0)
RBC: 5.4 Mil/uL (ref 4.22–5.81)
RDW: 14.8 % (ref 11.5–15.5)
WBC: 5.3 K/uL (ref 4.0–10.5)

## 2023-12-13 LAB — LIPID PANEL
Cholesterol: 111 mg/dL (ref 0–200)
HDL: 39.2 mg/dL (ref >39.00)
LDL Cholesterol: 55 mg/dL (ref 0–99)
NonHDL: 71.75
Total CHOL/HDL Ratio: 3
Triglycerides: 84 mg/dL (ref 0.0–149.0)
VLDL: 16.8 mg/dL (ref 0.0–40.0)

## 2023-12-13 LAB — HEPATIC FUNCTION PANEL
ALT: 29 U/L (ref 0–53)
AST: 23 U/L (ref 0–37)
Albumin: 4.7 g/dL (ref 3.5–5.2)
Alkaline Phosphatase: 77 U/L (ref 39–117)
Bilirubin, Direct: 0.3 mg/dL (ref 0.0–0.3)
Total Bilirubin: 1.7 mg/dL — ABNORMAL HIGH (ref 0.2–1.2)
Total Protein: 7.6 g/dL (ref 6.0–8.3)

## 2023-12-13 LAB — HEMOGLOBIN A1C: Hgb A1c MFr Bld: 5.9 % (ref 4.6–6.5)

## 2023-12-14 LAB — BASIC METABOLIC PANEL WITHOUT GFR
BUN: 18 mg/dL (ref 7–25)
CO2: 26 mmol/L (ref 20–32)
Calcium: 9.8 mg/dL (ref 8.6–10.3)
Chloride: 103 mmol/L (ref 98–110)
Creat: 1.03 mg/dL (ref 0.70–1.35)
Glucose, Bld: 105 mg/dL — ABNORMAL HIGH (ref 65–99)
Potassium: 5.1 mmol/L (ref 3.5–5.3)
Sodium: 138 mmol/L (ref 135–146)

## 2023-12-15 LAB — TSH RFX ON ABNORMAL TO FREE T4: TSH: 2.5 u[IU]/mL (ref 0.450–4.500)

## 2023-12-15 LAB — TESTOSTERONE,FREE AND TOTAL
Testosterone, Free: 11.9 pg/mL (ref 6.6–18.1)
Testosterone: 602 ng/dL (ref 264–916)

## 2023-12-21 ENCOUNTER — Ambulatory Visit: Payer: Self-pay | Admitting: Family Medicine

## 2024-02-01 ENCOUNTER — Other Ambulatory Visit: Payer: Self-pay | Admitting: Family Medicine

## 2024-02-01 DIAGNOSIS — E039 Hypothyroidism, unspecified: Secondary | ICD-10-CM

## 2024-02-01 NOTE — Telephone Encounter (Signed)
 Copied from CRM [id number]. Topic: Clinical - Medication Refill >> Feb 01, 2024  9:33 AM Nessti S wrote: Medication: levothyroxine  (SYNTHROID ) 100 MCG tablet   Has the patient contacted their pharmacy? Yes (Agent: If no, request that the patient contact the pharmacy for the refill. If patient does not wish to contact the pharmacy document the reason why and proceed with request.) (Agent: If yes, when and what did the pharmacy advise?)  This is the patient's preferred pharmacy:  [id number], [place] - [address] AT [facility] [address] [place] [address] Phone: [phone] Fax: [phone]  Is this the correct pharmacy for this prescription? Yes If no, delete pharmacy and type the correct one.   Has the prescription been filled recently? No  Is the patient out of the medication? No  Has the patient been seen for an appointment in the last year OR does the patient have an upcoming appointment? Yes  Can we respond through MyChart? Yes  Agent: Please be advised that Rx refills may take up to 3 business days. We ask that you follow-up with your pharmacy.
